# Patient Record
Sex: Male | Born: 1940
Health system: Southern US, Community
[De-identification: ages and names within clinical notes are randomized; demographics above are authoritative.]

## PROBLEM LIST (undated history)

## (undated) DIAGNOSIS — E7800 Pure hypercholesterolemia, unspecified: Secondary | ICD-10-CM

## (undated) DIAGNOSIS — I10 Essential (primary) hypertension: Secondary | ICD-10-CM

## (undated) DIAGNOSIS — E119 Type 2 diabetes mellitus without complications: Secondary | ICD-10-CM

## (undated) HISTORY — PX: BACK SURGERY: SHX140

---

## 2001-04-28 ENCOUNTER — Ambulatory Visit (HOSPITAL_COMMUNITY): Admission: RE | Admit: 2001-04-28 | Discharge: 2001-04-28 | Payer: Self-pay | Admitting: Family Medicine

## 2001-04-28 ENCOUNTER — Ambulatory Visit (HOSPITAL_COMMUNITY): Admission: RE | Admit: 2001-04-28 | Discharge: 2001-04-28 | Payer: Self-pay | Admitting: Pulmonary Disease

## 2001-04-28 ENCOUNTER — Encounter: Payer: Self-pay | Admitting: Family Medicine

## 2001-05-18 ENCOUNTER — Encounter: Payer: Self-pay | Admitting: Family Medicine

## 2001-05-18 ENCOUNTER — Ambulatory Visit (HOSPITAL_COMMUNITY): Admission: RE | Admit: 2001-05-18 | Discharge: 2001-05-18 | Payer: Self-pay | Admitting: Family Medicine

## 2001-06-29 ENCOUNTER — Encounter (INDEPENDENT_AMBULATORY_CARE_PROVIDER_SITE_OTHER): Payer: Self-pay | Admitting: Internal Medicine

## 2001-06-29 ENCOUNTER — Ambulatory Visit (HOSPITAL_COMMUNITY): Admission: RE | Admit: 2001-06-29 | Discharge: 2001-06-29 | Payer: Self-pay | Admitting: Internal Medicine

## 2002-05-08 ENCOUNTER — Ambulatory Visit (HOSPITAL_COMMUNITY): Admission: RE | Admit: 2002-05-08 | Discharge: 2002-05-08 | Payer: Self-pay | Admitting: Family Medicine

## 2002-05-08 ENCOUNTER — Encounter: Payer: Self-pay | Admitting: Family Medicine

## 2003-05-31 ENCOUNTER — Ambulatory Visit (HOSPITAL_COMMUNITY): Admission: RE | Admit: 2003-05-31 | Discharge: 2003-05-31 | Payer: Self-pay | Admitting: Family Medicine

## 2004-01-24 ENCOUNTER — Ambulatory Visit (HOSPITAL_COMMUNITY): Admission: RE | Admit: 2004-01-24 | Discharge: 2004-01-24 | Payer: Self-pay | Admitting: Urology

## 2006-03-08 ENCOUNTER — Ambulatory Visit (HOSPITAL_COMMUNITY): Admission: RE | Admit: 2006-03-08 | Discharge: 2006-03-08 | Payer: Self-pay | Admitting: Family Medicine

## 2010-05-17 ENCOUNTER — Encounter: Payer: Self-pay | Admitting: Family Medicine

## 2011-09-12 ENCOUNTER — Emergency Department (HOSPITAL_COMMUNITY): Payer: Medicare Other

## 2011-09-12 ENCOUNTER — Encounter (HOSPITAL_COMMUNITY): Payer: Self-pay | Admitting: *Deleted

## 2011-09-12 ENCOUNTER — Emergency Department (HOSPITAL_COMMUNITY)
Admission: EM | Admit: 2011-09-12 | Discharge: 2011-09-12 | Disposition: A | Discharge status: Home / Self Care | Payer: Medicare Other | Attending: Emergency Medicine | Admitting: Emergency Medicine

## 2011-09-12 DIAGNOSIS — R110 Nausea: Secondary | ICD-10-CM | POA: Insufficient documentation

## 2011-09-12 DIAGNOSIS — I498 Other specified cardiac arrhythmias: Secondary | ICD-10-CM | POA: Insufficient documentation

## 2011-09-12 DIAGNOSIS — I10 Essential (primary) hypertension: Secondary | ICD-10-CM | POA: Insufficient documentation

## 2011-09-12 DIAGNOSIS — J3489 Other specified disorders of nose and nasal sinuses: Secondary | ICD-10-CM | POA: Insufficient documentation

## 2011-09-12 DIAGNOSIS — I459 Conduction disorder, unspecified: Secondary | ICD-10-CM | POA: Insufficient documentation

## 2011-09-12 DIAGNOSIS — I440 Atrioventricular block, first degree: Secondary | ICD-10-CM | POA: Insufficient documentation

## 2011-09-12 DIAGNOSIS — H81399 Other peripheral vertigo, unspecified ear: Secondary | ICD-10-CM | POA: Insufficient documentation

## 2011-09-12 HISTORY — DX: Pure hypercholesterolemia, unspecified: E78.00

## 2011-09-12 HISTORY — DX: Essential (primary) hypertension: I10

## 2011-09-12 LAB — CBC
MCH: 31.2 pg (ref 26.0–34.0)
Platelets: 208 10*3/uL (ref 150–400)
RBC: 5.04 MIL/uL (ref 4.22–5.81)
RDW: 13 % (ref 11.5–15.5)
WBC: 10.8 10*3/uL — ABNORMAL HIGH (ref 4.0–10.5)

## 2011-09-12 LAB — BASIC METABOLIC PANEL
Calcium: 9.9 mg/dL (ref 8.4–10.5)
GFR calc Af Amer: >90 mL/min (ref >90)
GFR calc non Af Amer: 89 mL/min — ABNORMAL LOW (ref >90)
Glucose, Bld: 104 mg/dL — ABNORMAL HIGH (ref 70–99)
Potassium: 4.1 mEq/L (ref 3.5–5.1)
Sodium: 137 mEq/L (ref 135–145)

## 2011-09-12 LAB — DIFFERENTIAL
Basophils Absolute: 0 10*3/uL (ref 0.0–0.1)
Eosinophils Absolute: 0.2 10*3/uL (ref 0.0–0.7)
Lymphs Abs: 1.6 10*3/uL (ref 0.7–4.0)
Neutrophils Relative %: 78 % — ABNORMAL HIGH (ref 43–77)

## 2011-09-12 LAB — GLUCOSE, CAPILLARY: Glucose-Capillary: 178 mg/dL — ABNORMAL HIGH (ref 70–99)

## 2011-09-12 MED ORDER — MECLIZINE HCL 12.5 MG PO TABS
25.0000 mg | ORAL_TABLET | Freq: Once | ORAL | Status: AC
  Administered 2011-09-12: 25 mg via ORAL
  Filled 2011-09-12: qty 2

## 2011-09-12 MED ORDER — MECLIZINE HCL 25 MG PO TABS: 25.0000 mg | ORAL_TABLET | Freq: Three times a day (TID) | ORAL | Status: AC | PRN

## 2011-09-12 MED ORDER — SODIUM CHLORIDE 0.9 % IV BOLUS (SEPSIS)
500.0000 mL | Freq: Once | INTRAVENOUS | Status: AC
  Administered 2011-09-12: 500 mL via INTRAVENOUS

## 2011-09-12 MED ORDER — SODIUM CHLORIDE 0.9 % IV SOLN
Freq: Once | INTRAVENOUS | Status: AC
  Administered 2011-09-12: 16:00:00 via INTRAVENOUS

## 2011-09-12 MED ORDER — ONDANSETRON HCL 4 MG/2ML IJ SOLN
4.0000 mg | INTRAMUSCULAR | Status: DC | PRN
  Administered 2011-09-12: 4 mg via INTRAVENOUS
  Filled 2011-09-12: qty 2

## 2011-09-12 MED ORDER — SODIUM CHLORIDE 0.9 % IV SOLN
INTRAVENOUS | Status: DC
  Administered 2011-09-12: 17:00:00 via INTRAVENOUS

## 2011-09-12 NOTE — ED Notes (Signed)
Patient returned from xray at this time.

## 2011-09-12 NOTE — Discharge Instructions (Signed)
RESOURCE GUIDE  Dental Problems  Patients with Medicaid: [facility]                     [facility] [address]                                           [address] Phone:  [phone]                                                  Phone:  [phone]  If unable to pay or uninsured, contact:  [facility] or [facility] to become qualified for the adult dental clinic.  Chronic Pain Problems Contact [facility]  [phone] Patients need to be referred by their primary care doctor.  Insufficient Money for Medicine Contact [employer]:  call "211" or [facility] 271-5999.  No Primary Care Doctor Call [facility]  832-8000 Other agencies that provide inexpensive medical care    [facility]  [phone]    [facility]  [phone]    [facility]  [phone]    [facility] Clinic  [phone]    [facility]  [phone]    [facility]  [phone]  Psychological Services [facility]  [phone] [facility]  [phone] [facility]   800 853-5163 (emergency services 641-4993)  Substance Abuse Resources [facility]  336-882-2125 [facility] 336-784-9470 [facility] 336-285-9073 [facility] 336-845-3988 Residential & Outpatient Substance Abuse Program  800-659-3381  Abuse/Neglect [place] Child Abuse Hotline (336) 641-3795 [place] Child Abuse Hotline 800-378-5315 (After Hours)  Emergency Shelter [facility] (336) 271-5985  Maternity Homes [facility] (336) 275-9566 [facility] (704) 372-4663  MRSA Hotline #:   832-7006    [place] [facility]  [facility] [place]     [employer]                          [facility] [address] [place]                       [address]      [address]  [place]                                                [place]                            [place] Phone:  [phone]                                   Phone:  [phone]                 Phone:  [phone]  [facility] Phone:  [phone]    [facility] Child Abuse Hotline 8564561946 (276)111-6985 (After Hours)   Take the prescription as directed.  Call your regular medical doctor tomorrow morning to schedule a follow up appointment within the next 3 to 4 days.  Return to the Emergency Department immediately sooner if worsening.

## 2011-09-12 NOTE — ED Notes (Signed)
Patient with no complaints at this time. Respirations even and unlabored. Skin warm/dry. Discharge instructions reviewed with patient at this time. Patient given opportunity to voice concerns/ask questions. IV removed per policy and band-aid applied to site. Patient discharged at this time and left Emergency Department with steady gait.  

## 2011-09-12 NOTE — ED Notes (Signed)
Patient ambulated without difficulty and without complaints.

## 2011-09-12 NOTE — ED Notes (Signed)
Dr. McManus at bedside. 

## 2011-09-12 NOTE — ED Provider Notes (Signed)
History     CSN: [id number]  Arrival date & time 09/12/11  1331   First MD Initiated Contact with Patient 09/12/11 1458      Chief Complaint  Patient presents with  . Dizziness  . Hyperglycemia     HPI Pt was seen at 1510.   Per pt, c/o gradual onset and persistence of constantly feeling "dizzy" for the past 2 days.  Dizziness is described as a sense of movement and feeling "off balance" while walking.  Symptoms worsen with head turning side-to-side and changing positions; improve when sitting or laying still.  Has been associated with nausea, sinus and nasal congestion.  Denies abd pain, no vomiting/diarrhea, no CP/palpitations, no SOB/cough, no neck or back pain, no headache, no visual changes, no focal motor weakness, no tingling/numbness in extremities, no fevers, no rash, no head or neck injuries.     Past Medical History  Diagnosis Date  . Hypertension   . Hypercholesterolemia     Past Surgical History  Procedure Date  . Back surgery     History  Substance Use Topics  . Smoking status: Never Smoker   . Smokeless tobacco: Not on file  . Alcohol Use: No    Review of Systems ROS: Statement: All systems negative except as marked or noted in the HPI; Constitutional: Negative for fever and chills. ; ; Eyes: Negative for eye pain, redness and discharge. ; ; ENMT: Negative for ear pain, hoarseness, sore throat. +nasal congestion, sinus pressure. ; ; Cardiovascular: Negative for chest pain, palpitations, diaphoresis, dyspnea and peripheral edema. ; ; Respiratory: Negative for cough, wheezing and stridor. ; ; Gastrointestinal: +nausea. Negative for vomiting, diarrhea, abdominal pain, blood in stool, hematemesis, jaundice and rectal bleeding. . ; ; Genitourinary: Negative for dysuria, flank pain and hematuria. ; ; Musculoskeletal: Negative for back pain and neck pain. Negative for swelling and trauma.; ; Skin: Negative for pruritus, rash, abrasions, blisters, bruising and skin  lesion.; ; Neuro: +vertigo.  Negative for headache, lightheadedness and neck stiffness. Negative for weakness, altered level of consciousness , altered mental status, extremity weakness, paresthesias, involuntary movement, seizure and syncope.     Allergies  Other  Home Medications  No current outpatient prescriptions on file.  BP 118/68  Pulse 57  Temp 98 F (36.7 C)  Resp 20  Ht 6' (1.829 m)  Wt 197 lb (89.359 kg)  BMI 26.72 kg/m2  SpO2 98%  Physical Exam 1515: Physical examination:  Nursing notes reviewed; Vital signs and O2 SAT reviewed;  Constitutional: Well developed, Well nourished, Well hydrated, In no acute distress; Head:  Normocephalic, atraumatic; Eyes: EOMI, PERRL, No scleral icterus; ENMT: TM's clear bilat. +edemetous nasal turbinates bilat with clear rhinorrhea. Mouth and pharynx normal, Mucous membranes moist; Neck: Supple, Full range of motion, No lymphadenopathy; Cardiovascular: Regular rate and rhythm, No murmur or gallop; Respiratory: Breath sounds clear & equal bilaterally, No rales, rhonchi, wheezes, speaking full sentences with ease, Normal respiratory effort/excursion; Chest: Nontender, Movement normal; Abdomen: Soft, Nontender, Nondistended, Normal bowel sounds; Genitourinary: No CVA tenderness; Extremities: Pulses normal, No tenderness, No edema, No calf edema or asymmetry.; Neuro: AA&Ox3, Major CN grossly intact.  Strength 5/5 equal bilat UE's and LE's.  DTR 2/4 equal bilat UE's and LE's.  No gross sensory deficits.  Normal cerebellar testing bilat UE's and LE's. Speech clear.  No facial droop.  +right gaze horizontal fatigable nystagmus which reproduces pt's symptoms.;; Skin: Color normal, Warm, Dry, no rash.    ED Course  Procedures  MDM  MDM Reviewed: nursing note and vitals Interpretation: ECG, labs, x-ray and CT scan    Date: 09/12/2011  Rate: 56  Rhythm: sinus bradycardia  QRS Axis: normal  Intervals: PR prolonged  ST/T Wave abnormalities: early  repolarization  Conduction Disutrbances:first-degree A-V block  and nonspecific intraventricular conduction delay  Narrative Interpretation:   Old EKG Reviewed: none available.     Results for orders placed during the hospital encounter of 09/12/11  GLUCOSE, CAPILLARY      Component Value Range   Glucose-Capillary 178 (*) 70 - 99 (mg/dL)  TROPONIN I      Component Value Range   Troponin I <0.30  <0.30 (ng/mL)  CBC      Component Value Range   WBC 10.8 (*) 4.0 - 10.5 (K/uL)   RBC 5.04  4.22 - 5.81 (MIL/uL)   Hemoglobin 15.7  13.0 - 17.0 (g/dL)   HCT 16.1  09.6 - 04.5 (%)   MCV 90.5  78.0 - 100.0 (fL)   MCH 31.2  26.0 - 34.0 (pg)   MCHC 34.4  30.0 - 36.0 (g/dL)   RDW 40.9  81.1 - 91.4 (%)   Platelets 208  150 - 400 (K/uL)  DIFFERENTIAL      Component Value Range   Neutrophils Relative 78 (*) 43 - 77 (%)   Neutro Abs 8.4 (*) 1.7 - 7.7 (K/uL)   Lymphocytes Relative 15  12 - 46 (%)   Lymphs Abs 1.6  0.7 - 4.0 (K/uL)   Monocytes Relative 5  3 - 12 (%)   Monocytes Absolute 0.5  0.1 - 1.0 (K/uL)   Eosinophils Relative 2  0 - 5 (%)   Eosinophils Absolute 0.2  0.0 - 0.7 (K/uL)   Basophils Relative 0  0 - 1 (%)   Basophils Absolute 0.0  0.0 - 0.1 (K/uL)  BASIC METABOLIC PANEL      Component Value Range   Sodium 137  135 - 145 (mEq/L)   Potassium 4.1  3.5 - 5.1 (mEq/L)   Chloride 102  96 - 112 (mEq/L)   CO2 21  19 - 32 (mEq/L)   Glucose, Bld 104 (*) 70 - 99 (mg/dL)   BUN 11  6 - 23 (mg/dL)   Creatinine, Ser 7.82  0.50 - 1.35 (mg/dL)   Calcium 9.9  8.4 - 95.6 (mg/dL)   GFR calc non Af Amer 89 (*) >90 (mL/min)   GFR calc Af Amer >90  >90 (mL/min)   Dg Chest 2 View 09/12/2011  *RADIOLOGY REPORT*  Clinical Data: Dizziness.  Nausea and weakness.  CHEST - 2 VIEW  Comparison: None  Findings: Heart size and vascularity are normal and the lungs are clear except for a tiny area of linear atelectasis or scarring in the left lung base laterally.  No effusions.  No acute osseous abnormality.   IMPRESSION: No significant abnormality.  Original Report Authenticated By: Gwynn Burly, M.D.   Ct Head Wo Contrast 09/12/2011  *RADIOLOGY REPORT*  Clinical Data: Dizziness.  Nausea  CT HEAD WITHOUT CONTRAST  Technique:  Contiguous axial images were obtained from the base of the skull through the vertex without contrast.  Comparison: None.  Findings: Age appropriate atrophy.  Negative for intracranial hemorrhage, mass, or acute infarct.  Calvarium is intact. Paranasal sinuses and mastoid sinuses are clear.  IMPRESSION: Within normal limits for age.  Original Report Authenticated By: Camelia Phenes, M.D.     1745:  Pt states he feels "better" and wants to  go home now.  Pt has tol PO well while in the ED without N/V.  Has ambulated with steady gait, easy resps; denies "dizziness," CP, SOB or any other complaints.   Not symptomatic with orthostatic VS, though IVF given.  Wants to go home now.  Dx testing d/w pt.  Questions answered.  Verb understanding, agreeable to d/c home with outpt f/u.               Laray Anger, DO 09/15/11 1208

## 2011-09-12 NOTE — ED Notes (Addendum)
Pt presents to er with c/o "my blood sugar is up", pt c/o being dizzy, off balance, and elevated blood sugar, pt states that he is not diabetic and does not check his blood sugar but knows that it what is wrong with him, pt denies any weakness to extremities, speech clear, no facial drooping noted, admits to nausea

## 2015-07-30 ENCOUNTER — Telehealth: Payer: Self-pay

## 2015-07-30 NOTE — Telephone Encounter (Signed)
Pt was referred by Dr. Eden EmmsAriza for a screening colonoscopy and I sent him the letter. He called and said he has a lot going on at this time and he will call when he is ready to schedule. I am sending a note to his PCP.

## 2016-05-20 DIAGNOSIS — R69 Illness, unspecified: Secondary | ICD-10-CM | POA: Diagnosis not present

## 2016-06-22 DIAGNOSIS — E7800 Pure hypercholesterolemia, unspecified: Secondary | ICD-10-CM | POA: Diagnosis not present

## 2016-06-22 DIAGNOSIS — H6122 Impacted cerumen, left ear: Secondary | ICD-10-CM | POA: Diagnosis not present

## 2016-06-22 DIAGNOSIS — E118 Type 2 diabetes mellitus with unspecified complications: Secondary | ICD-10-CM | POA: Diagnosis not present

## 2016-06-22 DIAGNOSIS — I10 Essential (primary) hypertension: Secondary | ICD-10-CM | POA: Diagnosis not present

## 2016-07-27 DIAGNOSIS — I10 Essential (primary) hypertension: Secondary | ICD-10-CM | POA: Diagnosis not present

## 2016-07-27 DIAGNOSIS — E118 Type 2 diabetes mellitus with unspecified complications: Secondary | ICD-10-CM | POA: Diagnosis not present

## 2016-07-27 DIAGNOSIS — E785 Hyperlipidemia, unspecified: Secondary | ICD-10-CM | POA: Diagnosis not present

## 2016-08-28 DIAGNOSIS — E782 Mixed hyperlipidemia: Secondary | ICD-10-CM | POA: Diagnosis not present

## 2016-08-28 DIAGNOSIS — E1122 Type 2 diabetes mellitus with diabetic chronic kidney disease: Secondary | ICD-10-CM | POA: Diagnosis not present

## 2016-08-28 DIAGNOSIS — E1165 Type 2 diabetes mellitus with hyperglycemia: Secondary | ICD-10-CM | POA: Diagnosis not present

## 2016-08-28 DIAGNOSIS — N401 Enlarged prostate with lower urinary tract symptoms: Secondary | ICD-10-CM | POA: Diagnosis not present

## 2016-08-28 DIAGNOSIS — Z6824 Body mass index (BMI) 24.0-24.9, adult: Secondary | ICD-10-CM | POA: Diagnosis not present

## 2016-08-28 DIAGNOSIS — I10 Essential (primary) hypertension: Secondary | ICD-10-CM | POA: Diagnosis not present

## 2016-08-31 DIAGNOSIS — Z0001 Encounter for general adult medical examination with abnormal findings: Secondary | ICD-10-CM | POA: Diagnosis not present

## 2016-08-31 DIAGNOSIS — E1165 Type 2 diabetes mellitus with hyperglycemia: Secondary | ICD-10-CM | POA: Diagnosis not present

## 2016-08-31 DIAGNOSIS — E782 Mixed hyperlipidemia: Secondary | ICD-10-CM | POA: Diagnosis not present

## 2016-08-31 DIAGNOSIS — I10 Essential (primary) hypertension: Secondary | ICD-10-CM | POA: Diagnosis not present

## 2016-08-31 DIAGNOSIS — Z6824 Body mass index (BMI) 24.0-24.9, adult: Secondary | ICD-10-CM | POA: Diagnosis not present

## 2016-08-31 DIAGNOSIS — M25562 Pain in left knee: Secondary | ICD-10-CM | POA: Diagnosis not present

## 2016-08-31 DIAGNOSIS — N401 Enlarged prostate with lower urinary tract symptoms: Secondary | ICD-10-CM | POA: Diagnosis not present

## 2016-10-30 DIAGNOSIS — Z0000 Encounter for general adult medical examination without abnormal findings: Secondary | ICD-10-CM | POA: Diagnosis not present

## 2016-10-30 DIAGNOSIS — R42 Dizziness and giddiness: Secondary | ICD-10-CM | POA: Diagnosis not present

## 2016-10-30 DIAGNOSIS — Z7984 Long term (current) use of oral hypoglycemic drugs: Secondary | ICD-10-CM | POA: Diagnosis not present

## 2016-10-30 DIAGNOSIS — E785 Hyperlipidemia, unspecified: Secondary | ICD-10-CM | POA: Diagnosis not present

## 2016-10-30 DIAGNOSIS — M2550 Pain in unspecified joint: Secondary | ICD-10-CM | POA: Diagnosis not present

## 2016-10-30 DIAGNOSIS — I10 Essential (primary) hypertension: Secondary | ICD-10-CM | POA: Diagnosis not present

## 2016-10-30 DIAGNOSIS — G3184 Mild cognitive impairment, so stated: Secondary | ICD-10-CM | POA: Diagnosis not present

## 2016-10-30 DIAGNOSIS — N401 Enlarged prostate with lower urinary tract symptoms: Secondary | ICD-10-CM | POA: Diagnosis not present

## 2016-10-30 DIAGNOSIS — E119 Type 2 diabetes mellitus without complications: Secondary | ICD-10-CM | POA: Diagnosis not present

## 2016-10-30 DIAGNOSIS — R2243 Localized swelling, mass and lump, lower limb, bilateral: Secondary | ICD-10-CM | POA: Diagnosis not present

## 2016-12-30 DIAGNOSIS — I10 Essential (primary) hypertension: Secondary | ICD-10-CM | POA: Diagnosis not present

## 2016-12-30 DIAGNOSIS — E1165 Type 2 diabetes mellitus with hyperglycemia: Secondary | ICD-10-CM | POA: Diagnosis not present

## 2017-01-03 DIAGNOSIS — Z6823 Body mass index (BMI) 23.0-23.9, adult: Secondary | ICD-10-CM | POA: Diagnosis not present

## 2017-01-03 DIAGNOSIS — E1165 Type 2 diabetes mellitus with hyperglycemia: Secondary | ICD-10-CM | POA: Diagnosis not present

## 2017-01-03 DIAGNOSIS — Z0001 Encounter for general adult medical examination with abnormal findings: Secondary | ICD-10-CM | POA: Diagnosis not present

## 2017-01-03 DIAGNOSIS — Z23 Encounter for immunization: Secondary | ICD-10-CM | POA: Diagnosis not present

## 2017-01-03 DIAGNOSIS — M25562 Pain in left knee: Secondary | ICD-10-CM | POA: Diagnosis not present

## 2017-01-03 DIAGNOSIS — I499 Cardiac arrhythmia, unspecified: Secondary | ICD-10-CM | POA: Diagnosis not present

## 2017-01-03 DIAGNOSIS — E782 Mixed hyperlipidemia: Secondary | ICD-10-CM | POA: Diagnosis not present

## 2017-01-03 DIAGNOSIS — N401 Enlarged prostate with lower urinary tract symptoms: Secondary | ICD-10-CM | POA: Diagnosis not present

## 2017-01-03 DIAGNOSIS — I10 Essential (primary) hypertension: Secondary | ICD-10-CM | POA: Diagnosis not present

## 2017-01-19 DIAGNOSIS — N401 Enlarged prostate with lower urinary tract symptoms: Secondary | ICD-10-CM | POA: Diagnosis not present

## 2017-01-19 DIAGNOSIS — I10 Essential (primary) hypertension: Secondary | ICD-10-CM | POA: Diagnosis not present

## 2017-01-19 DIAGNOSIS — Z6823 Body mass index (BMI) 23.0-23.9, adult: Secondary | ICD-10-CM | POA: Diagnosis not present

## 2017-01-19 DIAGNOSIS — Z5181 Encounter for therapeutic drug level monitoring: Secondary | ICD-10-CM | POA: Diagnosis not present

## 2017-02-22 DIAGNOSIS — E119 Type 2 diabetes mellitus without complications: Secondary | ICD-10-CM | POA: Diagnosis not present

## 2017-03-31 DIAGNOSIS — N401 Enlarged prostate with lower urinary tract symptoms: Secondary | ICD-10-CM | POA: Diagnosis not present

## 2017-03-31 DIAGNOSIS — E782 Mixed hyperlipidemia: Secondary | ICD-10-CM | POA: Diagnosis not present

## 2017-03-31 DIAGNOSIS — I10 Essential (primary) hypertension: Secondary | ICD-10-CM | POA: Diagnosis not present

## 2017-03-31 DIAGNOSIS — E1165 Type 2 diabetes mellitus with hyperglycemia: Secondary | ICD-10-CM | POA: Diagnosis not present

## 2017-04-14 DIAGNOSIS — E782 Mixed hyperlipidemia: Secondary | ICD-10-CM | POA: Diagnosis not present

## 2017-04-14 DIAGNOSIS — Z0001 Encounter for general adult medical examination with abnormal findings: Secondary | ICD-10-CM | POA: Diagnosis not present

## 2017-04-14 DIAGNOSIS — M25562 Pain in left knee: Secondary | ICD-10-CM | POA: Diagnosis not present

## 2017-04-14 DIAGNOSIS — I10 Essential (primary) hypertension: Secondary | ICD-10-CM | POA: Diagnosis not present

## 2017-04-14 DIAGNOSIS — N401 Enlarged prostate with lower urinary tract symptoms: Secondary | ICD-10-CM | POA: Diagnosis not present

## 2017-04-14 DIAGNOSIS — E1165 Type 2 diabetes mellitus with hyperglycemia: Secondary | ICD-10-CM | POA: Diagnosis not present

## 2017-04-14 DIAGNOSIS — Z6823 Body mass index (BMI) 23.0-23.9, adult: Secondary | ICD-10-CM | POA: Diagnosis not present

## 2017-05-11 ENCOUNTER — Emergency Department (HOSPITAL_COMMUNITY)
Admission: EM | Admit: 2017-05-11 | Discharge: 2017-05-11 | Disposition: A | Discharge status: Home / Self Care | Payer: Medicare HMO | Attending: Emergency Medicine | Admitting: Emergency Medicine

## 2017-05-11 ENCOUNTER — Encounter (HOSPITAL_COMMUNITY): Payer: Self-pay | Admitting: Emergency Medicine

## 2017-05-11 ENCOUNTER — Emergency Department (HOSPITAL_COMMUNITY): Payer: Medicare HMO

## 2017-05-11 ENCOUNTER — Other Ambulatory Visit: Payer: Self-pay

## 2017-05-11 DIAGNOSIS — I10 Essential (primary) hypertension: Secondary | ICD-10-CM | POA: Insufficient documentation

## 2017-05-11 DIAGNOSIS — E119 Type 2 diabetes mellitus without complications: Secondary | ICD-10-CM | POA: Diagnosis not present

## 2017-05-11 DIAGNOSIS — R4020 Unspecified coma: Secondary | ICD-10-CM | POA: Diagnosis not present

## 2017-05-11 DIAGNOSIS — Z7984 Long term (current) use of oral hypoglycemic drugs: Secondary | ICD-10-CM | POA: Insufficient documentation

## 2017-05-11 DIAGNOSIS — R42 Dizziness and giddiness: Secondary | ICD-10-CM | POA: Insufficient documentation

## 2017-05-11 DIAGNOSIS — Z79899 Other long term (current) drug therapy: Secondary | ICD-10-CM | POA: Diagnosis not present

## 2017-05-11 HISTORY — DX: Type 2 diabetes mellitus without complications: E11.9

## 2017-05-11 LAB — CBC WITH DIFFERENTIAL/PLATELET
BASOS ABS: 0 10*3/uL (ref 0.0–0.1)
BASOS PCT: 1 %
EOS ABS: 0.2 10*3/uL (ref 0.0–0.7)
Eosinophils Relative: 2 %
HEMATOCRIT: 42.5 % (ref 39.0–52.0)
Hemoglobin: 14.1 g/dL (ref 13.0–17.0)
Lymphocytes Relative: 15 %
Lymphs Abs: 1.2 10*3/uL (ref 0.7–4.0)
MCH: 29.7 pg (ref 26.0–34.0)
MCHC: 33.2 g/dL (ref 30.0–36.0)
MCV: 89.7 fL (ref 78.0–100.0)
Monocytes Absolute: 0.3 10*3/uL (ref 0.1–1.0)
Monocytes Relative: 4 %
NEUTROS ABS: 6.6 10*3/uL (ref 1.7–7.7)
NEUTROS PCT: 78 %
Platelets: 172 10*3/uL (ref 150–400)
RBC: 4.74 MIL/uL (ref 4.22–5.81)
RDW: 13.5 % (ref 11.5–15.5)
WBC: 8.3 10*3/uL (ref 4.0–10.5)

## 2017-05-11 LAB — COMPREHENSIVE METABOLIC PANEL
ALBUMIN: 4.2 g/dL (ref 3.5–5.0)
ALK PHOS: 56 U/L (ref 38–126)
ALT: 13 U/L — ABNORMAL LOW (ref 17–63)
ANION GAP: 9 (ref 5–15)
AST: 15 U/L (ref 15–41)
BILIRUBIN TOTAL: 0.7 mg/dL (ref 0.3–1.2)
BUN: 19 mg/dL (ref 6–20)
CALCIUM: 9.8 mg/dL (ref 8.9–10.3)
CO2: 27 mmol/L (ref 22–32)
Chloride: 103 mmol/L (ref 101–111)
Creatinine, Ser: 0.9 mg/dL (ref 0.61–1.24)
GFR calc Af Amer: >60 mL/min (ref >60)
GFR calc non Af Amer: >60 mL/min (ref >60)
GLUCOSE: 155 mg/dL — AB (ref 65–99)
Potassium: 4.5 mmol/L (ref 3.5–5.1)
SODIUM: 139 mmol/L (ref 135–145)
TOTAL PROTEIN: 7.3 g/dL (ref 6.5–8.1)

## 2017-05-11 LAB — CBG MONITORING, ED
GLUCOSE-CAPILLARY: 153 mg/dL — AB (ref 65–99)
GLUCOSE-CAPILLARY: 142 mg/dL — AB (ref 65–99)

## 2017-05-11 LAB — TROPONIN I: Troponin I: <0.03 ng/mL (ref <0.03)

## 2017-05-11 MED ORDER — ONDANSETRON 4 MG PO TBDP: ORAL_TABLET | ORAL | 0 refills | Status: DC

## 2017-05-11 MED ORDER — MECLIZINE HCL 12.5 MG PO TABS
25.0000 mg | ORAL_TABLET | Freq: Once | ORAL | Status: AC
  Administered 2017-05-11: 25 mg via ORAL
  Filled 2017-05-11: qty 2

## 2017-05-11 MED ORDER — MECLIZINE HCL 25 MG PO TABS: 25.0000 mg | ORAL_TABLET | Freq: Three times a day (TID) | ORAL | 0 refills | Status: DC | PRN

## 2017-05-11 MED ORDER — SODIUM CHLORIDE 0.9 % IV BOLUS (SEPSIS)
500.0000 mL | Freq: Once | INTRAVENOUS | Status: AC
  Administered 2017-05-11: 500 mL via INTRAVENOUS

## 2017-05-11 MED ORDER — ONDANSETRON HCL 4 MG/2ML IJ SOLN
4.0000 mg | Freq: Once | INTRAMUSCULAR | Status: AC
  Administered 2017-05-11: 4 mg via INTRAVENOUS
  Filled 2017-05-11: qty 2

## 2017-05-11 NOTE — ED Triage Notes (Signed)
Pt reports his BP and glucose have been elevated. Woke this am with dizziness and vomiting. Pt was checked by EMS this am.

## 2017-05-11 NOTE — ED Notes (Signed)
Patient transported to CT 

## 2017-05-11 NOTE — ED Provider Notes (Signed)
[facility]ENCY DEPARTMENT Provider Note   CSN: [id number] Arrival date & time: 05/11/17  1117     History   Chief Complaint Chief Complaint  Patient presents with  . Dizziness    HPI Colton Johnson is a 77 y.o. male.  Patient complains of dizziness.  Patient states that today he started having the feeling that his head was spinning.  It is improving now    The history is provided by the patient.  Dizziness  Quality:  Head spinning Severity:  Moderate Onset quality:  Sudden Timing:  Constant Progression:  Waxing and waning Chronicity:  New Context: not when bending over   Relieved by:  Nothing Worsened by:  Nothing Ineffective treatments:  None tried Associated symptoms: no chest pain, no diarrhea and no headaches     Past Medical History:  Diagnosis Date  . Diabetes mellitus without complication (HCC)   . Hypercholesterolemia   . Hypertension     There are no active problems to display for this patient.   Past Surgical History:  Procedure Laterality Date  . BACK SURGERY         Home Medications    Prior to Admission medications   Medication Sig Start Date End Date Taking? Authorizing Provider  atenolol (TENORMIN) 25 MG tablet Take 25 mg by mouth daily.   Yes [provider]  metFORMIN (GLUCOPHAGE) 500 MG tablet Take 500 mg by mouth 3 (three) times daily.   Yes [provider]  simvastatin (ZOCOR) 40 MG tablet Take 40 mg by mouth daily.   Yes [provider]  tamsulosin (FLOMAX) 0.4 MG CAPS capsule Take 0.4 mg by mouth daily.   Yes [provider]  triamterene-hydrochlorothiazide (DYAZIDE) 37.5-25 MG capsule Take 1 capsule by mouth daily.   Yes [provider]  meclizine (ANTIVERT) 25 MG tablet Take 1 tablet (25 mg total) by mouth 3 (three) times daily as needed for dizziness. 05/11/17   Bethann BerkshireZammit, Shonte Beutler, MD  ondansetron (ZOFRAN ODT) 4 MG disintegrating tablet 4mg  ODT q4 hours prn nausea/vomit 05/11/17    Bethann BerkshireZammit, Jeannett Dekoning, MD    Family History Family History  Problem Relation Age of Onset  . Diabetes Mother   . Heart disease Father   . Cancer Brother     Social History Social History   Tobacco Use  . Smoking status: Never Smoker  . Smokeless tobacco: Current User    Types: Chew  Substance Use Topics  . Alcohol use: No  . Drug use: No     Allergies   Aleve [naproxen sodium] and Other   Review of Systems Review of Systems  Constitutional: Negative for appetite change and fatigue.  HENT: Negative for congestion, ear discharge and sinus pressure.   Eyes: Negative for discharge.  Respiratory: Negative for cough.   Cardiovascular: Negative for chest pain.  Gastrointestinal: Negative for abdominal pain and diarrhea.  Genitourinary: Negative for frequency and hematuria.  Musculoskeletal: Negative for back pain.  Skin: Negative for rash.  Neurological: Positive for dizziness. Negative for seizures and headaches.  Psychiatric/Behavioral: Negative for hallucinations.     Physical Exam Updated Vital Signs BP 126/66   Pulse (!) 53   Temp 97.6 F (36.4 C) (Oral)   Resp 12   Ht 6' (1.829 m)   Wt 80.7 kg (178 lb)   SpO2 100%   BMI 24.14 kg/m   Physical Exam  Constitutional: He is oriented to person, place, and time. He appears well-developed.  HENT:  Head: Normocephalic.  Eyes: Conjunctivae and EOM are normal. No scleral icterus.  Neck: Neck supple. No thyromegaly present.  Cardiovascular: Normal rate and regular rhythm. Exam reveals no gallop and no friction rub.  No murmur heard. Pulmonary/Chest: No stridor. He has no wheezes. He has no rales. He exhibits no tenderness.  Abdominal: He exhibits no distension. There is no tenderness. There is no rebound.  Musculoskeletal: Normal range of motion. He exhibits no edema.  Lymphadenopathy:    He has no cervical adenopathy.  Neurological: He is oriented to person, place, and time. He exhibits normal muscle tone.  Coordination normal.  Skin: No rash noted. No erythema.  Psychiatric: He has a normal mood and affect. His behavior is normal.     ED Treatments / Results  Labs (all labs ordered are listed, but only abnormal results are displayed) Labs Reviewed  COMPREHENSIVE METABOLIC PANEL - Abnormal; Notable for the following components:      Result Value   Glucose, Bld 155 (*)    ALT 13 (*)    All other components within normal limits  CBG MONITORING, ED - Abnormal; Notable for the following components:   Glucose-Capillary 153 (*)    All other components within normal limits  CBG MONITORING, ED - Abnormal; Notable for the following components:   Glucose-Capillary 142 (*)    All other components within normal limits  CBC WITH DIFFERENTIAL/PLATELET  TROPONIN I    EKG  EKG Interpretation  Date/Time:  Wednesday May 11 2017 11:47:55 EST Ventricular Rate:  54 PR Interval:  208 QRS Duration: 96 QT Interval:  378 QTC Calculation: 358 R Axis:   55 Text Interpretation:  Sinus bradycardia with Premature supraventricular complexes Otherwise normal ECG since last tracing no significant change Confirmed by Mancel Bale [phone]) on 05/11/2017 2:00:37 PM       Radiology Dg Chest 2 View  Result Date: 05/11/2017 CLINICAL DATA:  Awakened this morning with dizziness and vomiting. EXAM: CHEST  2 VIEW COMPARISON:  Chest x-ray of Sep 12, 2011 FINDINGS: The lungs are well-expanded. The interstitial markings are coarse. There is no alveolar infiltrate or pleural effusion. The heart and pulmonary vascularity are normal. There is calcification in the wall of the aortic arch. The observed bony thorax is unremarkable. The gas pattern in the upper abdomen is normal. IMPRESSION: No focal pneumonia nor CHF. Slight overall increase in the prominence of the pulmonary interstitial markings may reflect bronchitic changes either acute or chronic. Thoracic aortic atherosclerosis. Electronically Signed   By: David   [place] M.D.   On: 05/11/2017 16:12   Ct Head Wo Contrast  Result Date: 05/11/2017 CLINICAL DATA:  Altered level of consciousness. EXAM: CT HEAD WITHOUT CONTRAST TECHNIQUE: Contiguous axial images were obtained from the base of the skull through the vertex without intravenous contrast. COMPARISON:  CT head 09/12/2011 FINDINGS: Brain: No evidence of acute infarction, hemorrhage, hydrocephalus, extra-axial collection or mass lesion/mass effect. Vascular: Negative for hyperdense vessel Skull: Negative Sinuses/Orbits: Negative Other: None IMPRESSION: Negative CT head Electronically Signed   By: Marlan Palau M.D.   On: 05/11/2017 16:02    Procedures Procedures (including critical care time)  Medications Ordered in ED Medications  sodium chloride 0.9 % bolus 500 mL (not administered)  ondansetron (ZOFRAN) injection 4 mg (not administered)  meclizine (ANTIVERT) tablet 25 mg (not administered)     Initial Impression / Assessment and Plan / ED Course  I have reviewed the triage vital signs and the nursing notes.  Pertinent labs & imaging results that  were available during my care of the patient were reviewed by me and considered in my medical decision making (see chart for details).     Patient's labs and CT scan unremarkable.  Patient improved with Zofran and Antivert.  He will be sent home with his medicines and follow-up with PCP  Final Clinical Impressions(s) / ED Diagnoses   Final diagnoses:  Vertigo    ED Discharge Orders        Ordered    ondansetron (ZOFRAN ODT) 4 MG disintegrating tablet     05/11/17 1642    meclizine (ANTIVERT) 25 MG tablet  3 times daily PRN     05/11/17 1642       Bethann Berkshire, MD 05/11/17 1646

## 2017-05-11 NOTE — ED Notes (Signed)
Placed meal tray at the bedside for pt as he states that he is very hungry, EDP states that he may eat

## 2017-05-11 NOTE — ED Notes (Signed)
Pt ambulated to Bathroom with steady gait and minimal assistance

## 2017-05-11 NOTE — ED Notes (Signed)
Pt reports that he made a black walnut cake which he ate and this am when he woke up he could not walk.  He states that he also felt dizzy.  Pt feels like it was due to his blood sugar being up.  He reports that it was 170 this am.  Pt reports that he is feeling fine now.  No focal weakness, no dizziness, no changes in speech, no facial droop.  Pt is alert and oriented.  EDP is in to see pt at this time.  Pt is agitates about the wait and about not having eaten.  CBG at this time 142.

## 2017-05-11 NOTE — ED Notes (Signed)
Patient reports he has eaten a lot of sweets lately which he does not normally do. Pt states he took extra blood pressure medication this am.

## 2017-05-11 NOTE — ED Notes (Signed)
Patient transported to X-ray 

## 2017-05-11 NOTE — Discharge Instructions (Signed)
Drink plenty of fluids and follow-up with your doctor next week for recheck return if any problems

## 2017-06-15 DIAGNOSIS — E782 Mixed hyperlipidemia: Secondary | ICD-10-CM | POA: Diagnosis not present

## 2017-06-15 DIAGNOSIS — E1165 Type 2 diabetes mellitus with hyperglycemia: Secondary | ICD-10-CM | POA: Diagnosis not present

## 2017-06-15 DIAGNOSIS — I10 Essential (primary) hypertension: Secondary | ICD-10-CM | POA: Diagnosis not present

## 2017-07-15 DIAGNOSIS — H8149 Vertigo of central origin, unspecified ear: Secondary | ICD-10-CM | POA: Diagnosis not present

## 2017-07-15 DIAGNOSIS — M25562 Pain in left knee: Secondary | ICD-10-CM | POA: Diagnosis not present

## 2017-07-15 DIAGNOSIS — E1165 Type 2 diabetes mellitus with hyperglycemia: Secondary | ICD-10-CM | POA: Diagnosis not present

## 2017-07-15 DIAGNOSIS — I10 Essential (primary) hypertension: Secondary | ICD-10-CM | POA: Diagnosis not present

## 2017-07-15 DIAGNOSIS — Z6823 Body mass index (BMI) 23.0-23.9, adult: Secondary | ICD-10-CM | POA: Diagnosis not present

## 2017-07-15 DIAGNOSIS — Z0001 Encounter for general adult medical examination with abnormal findings: Secondary | ICD-10-CM | POA: Diagnosis not present

## 2017-07-15 DIAGNOSIS — E782 Mixed hyperlipidemia: Secondary | ICD-10-CM | POA: Diagnosis not present

## 2017-07-15 DIAGNOSIS — N401 Enlarged prostate with lower urinary tract symptoms: Secondary | ICD-10-CM | POA: Diagnosis not present

## 2017-07-18 DIAGNOSIS — I10 Essential (primary) hypertension: Secondary | ICD-10-CM | POA: Diagnosis not present

## 2017-07-18 DIAGNOSIS — H8149 Vertigo of central origin, unspecified ear: Secondary | ICD-10-CM | POA: Diagnosis not present

## 2017-07-18 DIAGNOSIS — Z6823 Body mass index (BMI) 23.0-23.9, adult: Secondary | ICD-10-CM | POA: Diagnosis not present

## 2017-07-18 DIAGNOSIS — E1165 Type 2 diabetes mellitus with hyperglycemia: Secondary | ICD-10-CM | POA: Diagnosis not present

## 2017-07-18 DIAGNOSIS — M25562 Pain in left knee: Secondary | ICD-10-CM | POA: Diagnosis not present

## 2017-07-18 DIAGNOSIS — N401 Enlarged prostate with lower urinary tract symptoms: Secondary | ICD-10-CM | POA: Diagnosis not present

## 2017-07-18 DIAGNOSIS — E782 Mixed hyperlipidemia: Secondary | ICD-10-CM | POA: Diagnosis not present

## 2017-10-15 DIAGNOSIS — Z6823 Body mass index (BMI) 23.0-23.9, adult: Secondary | ICD-10-CM | POA: Diagnosis not present

## 2017-10-15 DIAGNOSIS — Z0001 Encounter for general adult medical examination with abnormal findings: Secondary | ICD-10-CM | POA: Diagnosis not present

## 2017-10-15 DIAGNOSIS — H8149 Vertigo of central origin, unspecified ear: Secondary | ICD-10-CM | POA: Diagnosis not present

## 2017-10-15 DIAGNOSIS — E1165 Type 2 diabetes mellitus with hyperglycemia: Secondary | ICD-10-CM | POA: Diagnosis not present

## 2017-10-15 DIAGNOSIS — M25562 Pain in left knee: Secondary | ICD-10-CM | POA: Diagnosis not present

## 2017-10-15 DIAGNOSIS — E782 Mixed hyperlipidemia: Secondary | ICD-10-CM | POA: Diagnosis not present

## 2017-10-15 DIAGNOSIS — I10 Essential (primary) hypertension: Secondary | ICD-10-CM | POA: Diagnosis not present

## 2017-10-15 DIAGNOSIS — N401 Enlarged prostate with lower urinary tract symptoms: Secondary | ICD-10-CM | POA: Diagnosis not present

## 2017-11-16 DIAGNOSIS — Z6823 Body mass index (BMI) 23.0-23.9, adult: Secondary | ICD-10-CM | POA: Diagnosis not present

## 2017-11-16 DIAGNOSIS — I10 Essential (primary) hypertension: Secondary | ICD-10-CM | POA: Diagnosis not present

## 2017-11-16 DIAGNOSIS — H81399 Other peripheral vertigo, unspecified ear: Secondary | ICD-10-CM | POA: Diagnosis not present

## 2017-11-16 DIAGNOSIS — M25562 Pain in left knee: Secondary | ICD-10-CM | POA: Diagnosis not present

## 2017-11-16 DIAGNOSIS — E782 Mixed hyperlipidemia: Secondary | ICD-10-CM | POA: Diagnosis not present

## 2017-11-16 DIAGNOSIS — N401 Enlarged prostate with lower urinary tract symptoms: Secondary | ICD-10-CM | POA: Diagnosis not present

## 2017-11-16 DIAGNOSIS — E1165 Type 2 diabetes mellitus with hyperglycemia: Secondary | ICD-10-CM | POA: Diagnosis not present

## 2017-12-19 DIAGNOSIS — E1165 Type 2 diabetes mellitus with hyperglycemia: Secondary | ICD-10-CM | POA: Diagnosis not present

## 2017-12-19 DIAGNOSIS — E782 Mixed hyperlipidemia: Secondary | ICD-10-CM | POA: Diagnosis not present

## 2017-12-19 DIAGNOSIS — I10 Essential (primary) hypertension: Secondary | ICD-10-CM | POA: Diagnosis not present

## 2018-02-06 DIAGNOSIS — I10 Essential (primary) hypertension: Secondary | ICD-10-CM | POA: Diagnosis not present

## 2018-02-06 DIAGNOSIS — E1165 Type 2 diabetes mellitus with hyperglycemia: Secondary | ICD-10-CM | POA: Diagnosis not present

## 2018-02-06 DIAGNOSIS — E782 Mixed hyperlipidemia: Secondary | ICD-10-CM | POA: Diagnosis not present

## 2018-03-22 DIAGNOSIS — E782 Mixed hyperlipidemia: Secondary | ICD-10-CM | POA: Diagnosis not present

## 2018-03-22 DIAGNOSIS — E1165 Type 2 diabetes mellitus with hyperglycemia: Secondary | ICD-10-CM | POA: Diagnosis not present

## 2018-03-22 DIAGNOSIS — I10 Essential (primary) hypertension: Secondary | ICD-10-CM | POA: Diagnosis not present

## 2018-03-29 DIAGNOSIS — Z0001 Encounter for general adult medical examination with abnormal findings: Secondary | ICD-10-CM | POA: Diagnosis not present

## 2018-03-29 DIAGNOSIS — Z23 Encounter for immunization: Secondary | ICD-10-CM | POA: Diagnosis not present

## 2018-03-29 DIAGNOSIS — M25562 Pain in left knee: Secondary | ICD-10-CM | POA: Diagnosis not present

## 2018-03-29 DIAGNOSIS — E119 Type 2 diabetes mellitus without complications: Secondary | ICD-10-CM | POA: Diagnosis not present

## 2018-03-29 DIAGNOSIS — H538 Other visual disturbances: Secondary | ICD-10-CM | POA: Diagnosis not present

## 2018-03-29 DIAGNOSIS — N402 Nodular prostate without lower urinary tract symptoms: Secondary | ICD-10-CM | POA: Diagnosis not present

## 2018-03-29 DIAGNOSIS — E782 Mixed hyperlipidemia: Secondary | ICD-10-CM | POA: Diagnosis not present

## 2018-03-29 DIAGNOSIS — I10 Essential (primary) hypertension: Secondary | ICD-10-CM | POA: Diagnosis not present

## 2018-08-22 DIAGNOSIS — Z0000 Encounter for general adult medical examination without abnormal findings: Secondary | ICD-10-CM | POA: Diagnosis not present

## 2018-08-23 DIAGNOSIS — I10 Essential (primary) hypertension: Secondary | ICD-10-CM | POA: Diagnosis not present

## 2018-08-23 DIAGNOSIS — E1169 Type 2 diabetes mellitus with other specified complication: Secondary | ICD-10-CM | POA: Diagnosis not present

## 2018-08-23 DIAGNOSIS — E782 Mixed hyperlipidemia: Secondary | ICD-10-CM | POA: Diagnosis not present

## 2018-08-23 DIAGNOSIS — H538 Other visual disturbances: Secondary | ICD-10-CM | POA: Diagnosis not present

## 2018-08-23 DIAGNOSIS — M25562 Pain in left knee: Secondary | ICD-10-CM | POA: Diagnosis not present

## 2018-08-23 DIAGNOSIS — N401 Enlarged prostate with lower urinary tract symptoms: Secondary | ICD-10-CM | POA: Diagnosis not present

## 2018-08-23 DIAGNOSIS — M545 Low back pain: Secondary | ICD-10-CM | POA: Diagnosis not present

## 2018-08-24 DIAGNOSIS — I10 Essential (primary) hypertension: Secondary | ICD-10-CM | POA: Diagnosis not present

## 2018-08-24 DIAGNOSIS — E1165 Type 2 diabetes mellitus with hyperglycemia: Secondary | ICD-10-CM | POA: Diagnosis not present

## 2018-08-24 DIAGNOSIS — E782 Mixed hyperlipidemia: Secondary | ICD-10-CM | POA: Diagnosis not present

## 2018-08-25 DIAGNOSIS — E1165 Type 2 diabetes mellitus with hyperglycemia: Secondary | ICD-10-CM | POA: Diagnosis not present

## 2018-08-25 DIAGNOSIS — E782 Mixed hyperlipidemia: Secondary | ICD-10-CM | POA: Diagnosis not present

## 2018-08-25 DIAGNOSIS — I10 Essential (primary) hypertension: Secondary | ICD-10-CM | POA: Diagnosis not present

## 2018-08-29 DIAGNOSIS — T63311A Toxic effect of venom of black widow spider, accidental (unintentional), initial encounter: Secondary | ICD-10-CM | POA: Diagnosis not present

## 2018-10-13 DIAGNOSIS — E1165 Type 2 diabetes mellitus with hyperglycemia: Secondary | ICD-10-CM | POA: Diagnosis not present

## 2018-10-13 DIAGNOSIS — H538 Other visual disturbances: Secondary | ICD-10-CM | POA: Diagnosis not present

## 2018-10-13 DIAGNOSIS — E782 Mixed hyperlipidemia: Secondary | ICD-10-CM | POA: Diagnosis not present

## 2018-10-13 DIAGNOSIS — H81399 Other peripheral vertigo, unspecified ear: Secondary | ICD-10-CM | POA: Diagnosis not present

## 2018-10-13 DIAGNOSIS — N402 Nodular prostate without lower urinary tract symptoms: Secondary | ICD-10-CM | POA: Diagnosis not present

## 2018-10-13 DIAGNOSIS — E119 Type 2 diabetes mellitus without complications: Secondary | ICD-10-CM | POA: Diagnosis not present

## 2018-10-13 DIAGNOSIS — Z0001 Encounter for general adult medical examination with abnormal findings: Secondary | ICD-10-CM | POA: Diagnosis not present

## 2018-10-13 DIAGNOSIS — Z0000 Encounter for general adult medical examination without abnormal findings: Secondary | ICD-10-CM | POA: Diagnosis not present

## 2018-10-13 DIAGNOSIS — I10 Essential (primary) hypertension: Secondary | ICD-10-CM | POA: Diagnosis not present

## 2018-10-13 DIAGNOSIS — Z23 Encounter for immunization: Secondary | ICD-10-CM | POA: Diagnosis not present

## 2018-10-18 DIAGNOSIS — I10 Essential (primary) hypertension: Secondary | ICD-10-CM | POA: Diagnosis not present

## 2018-10-18 DIAGNOSIS — E119 Type 2 diabetes mellitus without complications: Secondary | ICD-10-CM | POA: Diagnosis not present

## 2018-10-18 DIAGNOSIS — M25562 Pain in left knee: Secondary | ICD-10-CM | POA: Diagnosis not present

## 2018-10-18 DIAGNOSIS — N402 Nodular prostate without lower urinary tract symptoms: Secondary | ICD-10-CM | POA: Diagnosis not present

## 2018-10-18 DIAGNOSIS — E782 Mixed hyperlipidemia: Secondary | ICD-10-CM | POA: Diagnosis not present

## 2018-10-18 DIAGNOSIS — H538 Other visual disturbances: Secondary | ICD-10-CM | POA: Diagnosis not present

## 2018-12-07 DIAGNOSIS — E1165 Type 2 diabetes mellitus with hyperglycemia: Secondary | ICD-10-CM | POA: Diagnosis not present

## 2018-12-07 DIAGNOSIS — I10 Essential (primary) hypertension: Secondary | ICD-10-CM | POA: Diagnosis not present

## 2018-12-07 DIAGNOSIS — E782 Mixed hyperlipidemia: Secondary | ICD-10-CM | POA: Diagnosis not present

## 2019-01-10 DIAGNOSIS — H5200 Hypermetropia, unspecified eye: Secondary | ICD-10-CM | POA: Diagnosis not present

## 2019-01-19 DIAGNOSIS — E119 Type 2 diabetes mellitus without complications: Secondary | ICD-10-CM | POA: Diagnosis not present

## 2019-01-19 DIAGNOSIS — I10 Essential (primary) hypertension: Secondary | ICD-10-CM | POA: Diagnosis not present

## 2019-01-19 DIAGNOSIS — E1165 Type 2 diabetes mellitus with hyperglycemia: Secondary | ICD-10-CM | POA: Diagnosis not present

## 2019-01-19 DIAGNOSIS — E782 Mixed hyperlipidemia: Secondary | ICD-10-CM | POA: Diagnosis not present

## 2019-01-19 DIAGNOSIS — E1169 Type 2 diabetes mellitus with other specified complication: Secondary | ICD-10-CM | POA: Diagnosis not present

## 2019-01-23 DIAGNOSIS — I10 Essential (primary) hypertension: Secondary | ICD-10-CM | POA: Diagnosis not present

## 2019-01-23 DIAGNOSIS — E119 Type 2 diabetes mellitus without complications: Secondary | ICD-10-CM | POA: Diagnosis not present

## 2019-01-23 DIAGNOSIS — F17220 Nicotine dependence, chewing tobacco, uncomplicated: Secondary | ICD-10-CM | POA: Diagnosis not present

## 2019-01-23 DIAGNOSIS — N402 Nodular prostate without lower urinary tract symptoms: Secondary | ICD-10-CM | POA: Diagnosis not present

## 2019-01-23 DIAGNOSIS — E782 Mixed hyperlipidemia: Secondary | ICD-10-CM | POA: Diagnosis not present

## 2019-01-23 DIAGNOSIS — M25562 Pain in left knee: Secondary | ICD-10-CM | POA: Diagnosis not present

## 2019-02-16 DIAGNOSIS — M25562 Pain in left knee: Secondary | ICD-10-CM | POA: Diagnosis not present

## 2019-02-16 DIAGNOSIS — N402 Nodular prostate without lower urinary tract symptoms: Secondary | ICD-10-CM | POA: Diagnosis not present

## 2019-02-16 DIAGNOSIS — F17220 Nicotine dependence, chewing tobacco, uncomplicated: Secondary | ICD-10-CM | POA: Diagnosis not present

## 2019-02-16 DIAGNOSIS — I10 Essential (primary) hypertension: Secondary | ICD-10-CM | POA: Diagnosis not present

## 2019-02-16 DIAGNOSIS — E782 Mixed hyperlipidemia: Secondary | ICD-10-CM | POA: Diagnosis not present

## 2019-02-16 DIAGNOSIS — E119 Type 2 diabetes mellitus without complications: Secondary | ICD-10-CM | POA: Diagnosis not present

## 2019-03-16 DIAGNOSIS — I10 Essential (primary) hypertension: Secondary | ICD-10-CM | POA: Diagnosis not present

## 2019-03-16 DIAGNOSIS — M25562 Pain in left knee: Secondary | ICD-10-CM | POA: Diagnosis not present

## 2019-03-16 DIAGNOSIS — F17220 Nicotine dependence, chewing tobacco, uncomplicated: Secondary | ICD-10-CM | POA: Diagnosis not present

## 2019-03-16 DIAGNOSIS — E782 Mixed hyperlipidemia: Secondary | ICD-10-CM | POA: Diagnosis not present

## 2019-03-16 DIAGNOSIS — N402 Nodular prostate without lower urinary tract symptoms: Secondary | ICD-10-CM | POA: Diagnosis not present

## 2019-03-16 DIAGNOSIS — E119 Type 2 diabetes mellitus without complications: Secondary | ICD-10-CM | POA: Diagnosis not present

## 2019-04-04 DIAGNOSIS — I10 Essential (primary) hypertension: Secondary | ICD-10-CM | POA: Diagnosis not present

## 2019-04-04 DIAGNOSIS — E1165 Type 2 diabetes mellitus with hyperglycemia: Secondary | ICD-10-CM | POA: Diagnosis not present

## 2019-04-04 DIAGNOSIS — E7849 Other hyperlipidemia: Secondary | ICD-10-CM | POA: Diagnosis not present

## 2019-04-29 ENCOUNTER — Emergency Department (HOSPITAL_COMMUNITY)
Admission: EM | Admit: 2019-04-29 | Discharge: 2019-04-29 | Disposition: A | Discharge status: Home / Self Care | Payer: Medicare HMO | Attending: Emergency Medicine | Admitting: Emergency Medicine

## 2019-04-29 ENCOUNTER — Encounter (HOSPITAL_COMMUNITY): Payer: Self-pay | Admitting: Emergency Medicine

## 2019-04-29 DIAGNOSIS — Z7984 Long term (current) use of oral hypoglycemic drugs: Secondary | ICD-10-CM | POA: Insufficient documentation

## 2019-04-29 DIAGNOSIS — E119 Type 2 diabetes mellitus without complications: Secondary | ICD-10-CM | POA: Insufficient documentation

## 2019-04-29 DIAGNOSIS — I10 Essential (primary) hypertension: Secondary | ICD-10-CM | POA: Diagnosis not present

## 2019-04-29 DIAGNOSIS — Z79899 Other long term (current) drug therapy: Secondary | ICD-10-CM | POA: Diagnosis not present

## 2019-04-29 LAB — BASIC METABOLIC PANEL
Anion gap: 7 (ref 5–15)
BUN: 26 mg/dL — ABNORMAL HIGH (ref 8–23)
CO2: 26 mmol/L (ref 22–32)
Calcium: 9.2 mg/dL (ref 8.9–10.3)
Chloride: 104 mmol/L (ref 98–111)
Creatinine, Ser: 0.93 mg/dL (ref 0.61–1.24)
GFR calc Af Amer: >60 mL/min (ref >60)
GFR calc non Af Amer: >60 mL/min (ref >60)
Glucose, Bld: 145 mg/dL — ABNORMAL HIGH (ref 70–99)
Potassium: 4.5 mmol/L (ref 3.5–5.1)
Sodium: 137 mmol/L (ref 135–145)

## 2019-04-29 LAB — CBC
HCT: 39.7 % (ref 39.0–52.0)
Hemoglobin: 13.1 g/dL (ref 13.0–17.0)
MCH: 30.8 pg (ref 26.0–34.0)
MCHC: 33 g/dL (ref 30.0–36.0)
MCV: 93.4 fL (ref 80.0–100.0)
Platelets: 162 10*3/uL (ref 150–400)
RBC: 4.25 MIL/uL (ref 4.22–5.81)
RDW: 13.5 % (ref 11.5–15.5)
WBC: 7.2 10*3/uL (ref 4.0–10.5)
nRBC: 0 % (ref 0.0–0.2)

## 2019-04-29 NOTE — Discharge Instructions (Signed)
Please follow up with your primary care provider for recheck of your blood pressure.  Your labs are normal today.

## 2019-04-29 NOTE — ED Triage Notes (Signed)
Patient reports HTN with dizziness X3 days. Patient had a pressure of 171/72 at home. Patient reports he took a [place] 50 mg at 1300. Denies N/V/D or pain.

## 2019-04-29 NOTE — ED Provider Notes (Signed)
[facility] EMERGENCY DEPARTMENT Provider Note   CSN: [id number] Arrival date & time: 04/29/19  1337     History Chief Complaint  Patient presents with  . Hypertension    Colton Johnson is a 79 y.o. male.  The history is provided by the patient. No language interpreter was used.  Hypertension       79 year old male with history of hypertension currently on atenolol, diabetes, hypercholesterolemia presenting complaining of elevated blood pressure.  Patient reports he normally checks his blood pressure every other day.  For the past 2 days he checked his blood pressure and noticed that it is higher than his usual.  His blood pressure normally is between 130-140 systolic but lately it has been in the 170 systolic.  Today he woke up late, check his blood pressure and states that it was 170s.  He then took his normal blood pressure medication and decided to come to the ER for further evaluation.  He denies having any active pain.  He did report some mild intermittent lightheadedness sometimes brought on while walking.  Does not happen all the time.  No room spinning sensation, no severe headache, neck pain, chest pain, trouble breathing, focal numbness or focal weakness or increased confusion.  No recent medication changes.  No dietary changes.  Past Medical History:  Diagnosis Date  . Diabetes mellitus without complication (HCC)   . Hypercholesterolemia   . Hypertension     There are no problems to display for this patient.   Past Surgical History:  Procedure Laterality Date  . BACK SURGERY         Family History  Problem Relation Age of Onset  . Diabetes Mother   . Heart disease Father   . Cancer Brother     Social History   Tobacco Use  . Smoking status: Never Smoker  . Smokeless tobacco: Current User    Types: Chew  Substance Use Topics  . Alcohol use: No  . Drug use: No    Home Medications Prior to Admission medications   Medication Sig Start Date End  Date Taking? Authorizing Provider  atenolol (TENORMIN) 25 MG tablet Take 25 mg by mouth daily.    [provider]  meclizine (ANTIVERT) 25 MG tablet Take 1 tablet (25 mg total) by mouth 3 (three) times daily as needed for dizziness. 05/11/17   Bethann Berkshire, MD  metFORMIN (GLUCOPHAGE) 500 MG tablet Take 500 mg by mouth 3 (three) times daily.    [provider]  ondansetron (ZOFRAN ODT) 4 MG disintegrating tablet 4mg  ODT q4 hours prn nausea/vomit 05/11/17   05/13/17, MD  simvastatin (ZOCOR) 40 MG tablet Take 40 mg by mouth daily.    [provider]  tamsulosin (FLOMAX) 0.4 MG CAPS capsule Take 0.4 mg by mouth daily.    [provider]  triamterene-hydrochlorothiazide (DYAZIDE) 37.5-25 MG capsule Take 1 capsule by mouth daily.    [provider]    Allergies    Aleve [naproxen sodium] and Other  Review of Systems   Review of Systems  All other systems reviewed and are negative.   Physical Exam Updated Vital Signs BP (!) 149/69   Pulse 65   Temp 98.1 F (36.7 C) (Oral)   Resp 16   Ht 6' (1.829 m)   Wt 80.7 kg   SpO2 100%   BMI 24.14 kg/m   Physical Exam Vitals and nursing note reviewed.  Constitutional:      General: He is  not in acute distress.    Appearance: He is well-developed.  HENT:     Head: Atraumatic.  Eyes:     Conjunctiva/sclera: Conjunctivae normal.  Cardiovascular:     Rate and Rhythm: Normal rate and regular rhythm.     Pulses: Normal pulses.     Heart sounds: Normal heart sounds.  Pulmonary:     Effort: Pulmonary effort is normal.     Breath sounds: Normal breath sounds.  Abdominal:     Palpations: Abdomen is soft.  Musculoskeletal:     Cervical back: Normal range of motion and neck supple. No rigidity.  Skin:    Findings: No rash.  Neurological:     Mental Status: He is alert and oriented to person, place, and time.     GCS: GCS eye subscore is 4. GCS verbal subscore is 5. GCS motor subscore is 6.      Cranial Nerves: Cranial nerves are intact.     Sensory: Sensation is intact.     Motor: Motor function is intact.     Coordination: Coordination is intact.     Gait: Gait is intact.     ED Results / Procedures / Treatments   Labs (all labs ordered are listed, but only abnormal results are displayed) Labs Reviewed  BASIC METABOLIC PANEL - Abnormal; Notable for the following components:      Result Value   Glucose, Bld 145 (*)    BUN 26 (*)    All other components within normal limits  CBC  I-STAT CHEM 8, ED    EKG EKG Interpretation  Date/Time:  Sunday April 29 2019 17:04:40 EST Ventricular Rate:  60 PR Interval:    QRS Duration: 97 QT Interval:  403 QTC Calculation: 403 R Axis:   44 Text Interpretation: Sinus rhythm Premature atrial complexes Artifact Baseline wander Abnormal ECG Confirmed by Carmin Muskrat [phone]) on 04/29/2019 5:08:59 PM   Radiology No results found.  Procedures Procedures (including critical care time)  Medications Ordered in ED Medications - No data to display  ED Course  I have reviewed the triage vital signs and the nursing notes.  Pertinent labs & imaging results that were available during my care of the patient were reviewed by me and considered in my medical decision making (see chart for details).    MDM Rules/Calculators/A&P                      BP (!) 148/86   Pulse 65   Temp 98.1 F (36.7 C) (Oral)   Resp 16   Ht 6' (1.829 m)   Wt 80.7 kg   SpO2 100%   BMI 24.14 kg/m   Final Clinical Impression(s) / ED Diagnoses Final diagnoses:  Essential hypertension    Rx / DC Orders ED Discharge Orders    None     4:20 PM Patient here with concerns of high blood pressure.  He was concerned because his blood pressure has been in the 809 systolic for the past 2 days.  He took his usual blood pressure medication and came here.  Current blood pressure is 149/69.  Patient reports some mild lightheadedness however.  7:59  PM Labs are reassuring, normal EKG.  BP normalized on recheck without intervention.  Pt stable for discharge.  Care discussed with Dr. Vanita Panda.  Recommend f/u witih PCP   Domenic Moras, PA-C 04/29/19 1959    Carmin Muskrat, MD 05/01/19 1034

## 2019-05-03 DIAGNOSIS — I10 Essential (primary) hypertension: Secondary | ICD-10-CM | POA: Diagnosis not present

## 2019-05-03 DIAGNOSIS — E119 Type 2 diabetes mellitus without complications: Secondary | ICD-10-CM | POA: Diagnosis not present

## 2019-05-03 DIAGNOSIS — E1165 Type 2 diabetes mellitus with hyperglycemia: Secondary | ICD-10-CM | POA: Diagnosis not present

## 2019-05-03 DIAGNOSIS — E1169 Type 2 diabetes mellitus with other specified complication: Secondary | ICD-10-CM | POA: Diagnosis not present

## 2019-05-03 DIAGNOSIS — E782 Mixed hyperlipidemia: Secondary | ICD-10-CM | POA: Diagnosis not present

## 2019-05-08 DIAGNOSIS — I10 Essential (primary) hypertension: Secondary | ICD-10-CM | POA: Diagnosis not present

## 2019-05-08 DIAGNOSIS — M5442 Lumbago with sciatica, left side: Secondary | ICD-10-CM | POA: Diagnosis not present

## 2019-05-08 DIAGNOSIS — H814 Vertigo of central origin: Secondary | ICD-10-CM | POA: Diagnosis not present

## 2019-05-08 DIAGNOSIS — M25562 Pain in left knee: Secondary | ICD-10-CM | POA: Diagnosis not present

## 2019-05-08 DIAGNOSIS — Z0001 Encounter for general adult medical examination with abnormal findings: Secondary | ICD-10-CM | POA: Diagnosis not present

## 2019-05-08 DIAGNOSIS — Z23 Encounter for immunization: Secondary | ICD-10-CM | POA: Diagnosis not present

## 2019-05-08 DIAGNOSIS — E119 Type 2 diabetes mellitus without complications: Secondary | ICD-10-CM | POA: Diagnosis not present

## 2019-05-08 DIAGNOSIS — B351 Tinea unguium: Secondary | ICD-10-CM | POA: Diagnosis not present

## 2019-05-08 DIAGNOSIS — H81399 Other peripheral vertigo, unspecified ear: Secondary | ICD-10-CM | POA: Diagnosis not present

## 2019-05-08 DIAGNOSIS — E782 Mixed hyperlipidemia: Secondary | ICD-10-CM | POA: Diagnosis not present

## 2019-05-08 DIAGNOSIS — N402 Nodular prostate without lower urinary tract symptoms: Secondary | ICD-10-CM | POA: Diagnosis not present

## 2019-05-08 DIAGNOSIS — F17220 Nicotine dependence, chewing tobacco, uncomplicated: Secondary | ICD-10-CM | POA: Diagnosis not present

## 2019-05-08 DIAGNOSIS — Z0000 Encounter for general adult medical examination without abnormal findings: Secondary | ICD-10-CM | POA: Diagnosis not present

## 2019-05-08 DIAGNOSIS — T63311A Toxic effect of venom of black widow spider, accidental (unintentional), initial encounter: Secondary | ICD-10-CM | POA: Diagnosis not present

## 2019-05-22 DIAGNOSIS — I10 Essential (primary) hypertension: Secondary | ICD-10-CM | POA: Diagnosis not present

## 2019-05-22 DIAGNOSIS — M5442 Lumbago with sciatica, left side: Secondary | ICD-10-CM | POA: Diagnosis not present

## 2019-05-22 DIAGNOSIS — R079 Chest pain, unspecified: Secondary | ICD-10-CM | POA: Diagnosis not present

## 2019-05-23 DIAGNOSIS — E782 Mixed hyperlipidemia: Secondary | ICD-10-CM | POA: Diagnosis not present

## 2019-05-23 DIAGNOSIS — B351 Tinea unguium: Secondary | ICD-10-CM | POA: Diagnosis not present

## 2019-05-23 DIAGNOSIS — M5442 Lumbago with sciatica, left side: Secondary | ICD-10-CM | POA: Diagnosis not present

## 2019-05-23 DIAGNOSIS — M25562 Pain in left knee: Secondary | ICD-10-CM | POA: Diagnosis not present

## 2019-05-23 DIAGNOSIS — N402 Nodular prostate without lower urinary tract symptoms: Secondary | ICD-10-CM | POA: Diagnosis not present

## 2019-05-23 DIAGNOSIS — F17220 Nicotine dependence, chewing tobacco, uncomplicated: Secondary | ICD-10-CM | POA: Diagnosis not present

## 2019-05-23 DIAGNOSIS — E119 Type 2 diabetes mellitus without complications: Secondary | ICD-10-CM | POA: Diagnosis not present

## 2019-05-23 DIAGNOSIS — I10 Essential (primary) hypertension: Secondary | ICD-10-CM | POA: Diagnosis not present

## 2019-06-05 DIAGNOSIS — I10 Essential (primary) hypertension: Secondary | ICD-10-CM | POA: Diagnosis not present

## 2019-06-05 DIAGNOSIS — M5442 Lumbago with sciatica, left side: Secondary | ICD-10-CM | POA: Diagnosis not present

## 2019-06-05 DIAGNOSIS — R079 Chest pain, unspecified: Secondary | ICD-10-CM | POA: Diagnosis not present

## 2019-06-05 DIAGNOSIS — K219 Gastro-esophageal reflux disease without esophagitis: Secondary | ICD-10-CM | POA: Diagnosis not present

## 2019-06-07 DIAGNOSIS — I10 Essential (primary) hypertension: Secondary | ICD-10-CM | POA: Diagnosis not present

## 2019-06-07 DIAGNOSIS — E7849 Other hyperlipidemia: Secondary | ICD-10-CM | POA: Diagnosis not present

## 2019-06-07 DIAGNOSIS — E1165 Type 2 diabetes mellitus with hyperglycemia: Secondary | ICD-10-CM | POA: Diagnosis not present

## 2019-06-18 DIAGNOSIS — M5416 Radiculopathy, lumbar region: Secondary | ICD-10-CM | POA: Diagnosis not present

## 2019-06-18 DIAGNOSIS — M545 Low back pain: Secondary | ICD-10-CM | POA: Diagnosis not present

## 2019-06-18 DIAGNOSIS — Z9889 Other specified postprocedural states: Secondary | ICD-10-CM | POA: Diagnosis not present

## 2019-06-26 DIAGNOSIS — M545 Low back pain: Secondary | ICD-10-CM | POA: Diagnosis not present

## 2019-07-02 DIAGNOSIS — M5136 Other intervertebral disc degeneration, lumbar region: Secondary | ICD-10-CM | POA: Diagnosis not present

## 2019-07-02 DIAGNOSIS — M4310 Spondylolisthesis, site unspecified: Secondary | ICD-10-CM | POA: Diagnosis not present

## 2019-07-02 DIAGNOSIS — M961 Postlaminectomy syndrome, not elsewhere classified: Secondary | ICD-10-CM | POA: Diagnosis not present

## 2019-07-09 DIAGNOSIS — M5136 Other intervertebral disc degeneration, lumbar region: Secondary | ICD-10-CM | POA: Diagnosis not present

## 2019-07-09 DIAGNOSIS — N281 Cyst of kidney, acquired: Secondary | ICD-10-CM | POA: Diagnosis not present

## 2019-07-09 DIAGNOSIS — F17221 Nicotine dependence, chewing tobacco, in remission: Secondary | ICD-10-CM | POA: Diagnosis not present

## 2019-07-09 DIAGNOSIS — M961 Postlaminectomy syndrome, not elsewhere classified: Secondary | ICD-10-CM | POA: Diagnosis not present

## 2019-07-14 ENCOUNTER — Other Ambulatory Visit: Payer: Self-pay

## 2019-07-14 ENCOUNTER — Encounter (HOSPITAL_COMMUNITY): Payer: Self-pay | Admitting: Emergency Medicine

## 2019-07-14 ENCOUNTER — Emergency Department (HOSPITAL_COMMUNITY)
Admission: EM | Admit: 2019-07-14 | Discharge: 2019-07-14 | Disposition: A | Discharge status: Home / Self Care | Payer: Medicare HMO | Attending: Emergency Medicine | Admitting: Emergency Medicine

## 2019-07-14 DIAGNOSIS — E119 Type 2 diabetes mellitus without complications: Secondary | ICD-10-CM | POA: Diagnosis not present

## 2019-07-14 DIAGNOSIS — M545 Low back pain, unspecified: Secondary | ICD-10-CM

## 2019-07-14 DIAGNOSIS — I10 Essential (primary) hypertension: Secondary | ICD-10-CM | POA: Insufficient documentation

## 2019-07-14 DIAGNOSIS — Z7984 Long term (current) use of oral hypoglycemic drugs: Secondary | ICD-10-CM | POA: Insufficient documentation

## 2019-07-14 DIAGNOSIS — F17220 Nicotine dependence, chewing tobacco, uncomplicated: Secondary | ICD-10-CM | POA: Diagnosis not present

## 2019-07-14 DIAGNOSIS — Z79899 Other long term (current) drug therapy: Secondary | ICD-10-CM | POA: Insufficient documentation

## 2019-07-14 MED ORDER — DIAZEPAM 2 MG PO TABS: 2.0000 mg | ORAL_TABLET | Freq: Three times a day (TID) | ORAL | 0 refills | Status: DC | PRN

## 2019-07-14 MED ORDER — DIAZEPAM 5 MG PO TABS
5.0000 mg | ORAL_TABLET | Freq: Once | ORAL | Status: AC
  Administered 2019-07-14: 14:00:00 5 mg via ORAL
  Filled 2019-07-14: qty 1

## 2019-07-14 NOTE — ED Provider Notes (Signed)
[facility] EMERGENCY DEPARTMENT Provider Note   CSN: [id number] Arrival date & time: 07/14/19  1328     History Chief Complaint  Patient presents with  . Back Pain    Colton Johnson is a 79 y.o. male.  79 year old male presents with worsening chronic back pain.  Is scheduled to have an epidural injection in 3 days.  Has been using tramadol with temporary relief.  Pain is localized to his lower back and not associate with bowel or bladder dysfunction.  Notes that Colton Johnson has not had any urinary symptoms.  Symptoms better with remaining still        Past Medical History:  Diagnosis Date  . Diabetes mellitus without complication (HCC)   . Hypercholesterolemia   . Hypertension     There are no problems to display for this patient.   Past Surgical History:  Procedure Laterality Date  . BACK SURGERY         Family History  Problem Relation Age of Onset  . Diabetes Mother   . Heart disease Father   . Cancer Brother     Social History   Tobacco Use  . Smoking status: Never Smoker  . Smokeless tobacco: Current User    Types: Chew  Substance Use Topics  . Alcohol use: No  . Drug use: No    Home Medications Prior to Admission medications   Medication Sig Start Date End Date Taking? Authorizing Provider  atenolol (TENORMIN) 25 MG tablet Take 25 mg by mouth daily.    [provider]  meclizine (ANTIVERT) 25 MG tablet Take 1 tablet (25 mg total) by mouth 3 (three) times daily as needed for dizziness. 05/11/17   Bethann Berkshire, MD  metFORMIN (GLUCOPHAGE) 500 MG tablet Take 500 mg by mouth 3 (three) times daily.    [provider]  ondansetron (ZOFRAN ODT) 4 MG disintegrating tablet 4mg  ODT q4 hours prn nausea/vomit 05/11/17   05/13/17, MD  simvastatin (ZOCOR) 40 MG tablet Take 40 mg by mouth daily.    [provider]  tamsulosin (FLOMAX) 0.4 MG CAPS capsule Take 0.4 mg by mouth daily.    [provider]    triamterene-hydrochlorothiazide (DYAZIDE) 37.5-25 MG capsule Take 1 capsule by mouth daily.    [provider]    Allergies    Aleve [naproxen sodium] and Other  Review of Systems   Review of Systems  All other systems reviewed and are negative.   Physical Exam Updated Vital Signs BP (!) 141/86 (BP Location: Right Arm)   Pulse 76   Temp 97.8 F (36.6 C) (Oral)   Resp 18   Ht 1.803 m (5\' 11" )   Wt 83.9 kg   SpO2 96%   BMI 25.80 kg/m   Physical Exam Vitals and nursing note reviewed.  Constitutional:      General: Colton Johnson is not in acute distress.    Appearance: Normal appearance. Colton Johnson is well-developed. Colton Johnson is not toxic-appearing.  HENT:     Head: Normocephalic and atraumatic.  Eyes:     General: Lids are normal.     Conjunctiva/sclera: Conjunctivae normal.     Pupils: Pupils are equal, round, and reactive to light.  Neck:     Thyroid: No thyroid mass.     Trachea: No tracheal deviation.  Cardiovascular:     Rate and Rhythm: Normal rate and regular rhythm.     Heart sounds: Normal heart sounds. No murmur. No gallop.   Pulmonary:  Effort: Pulmonary effort is normal. No respiratory distress.     Breath sounds: Normal breath sounds. No stridor. No decreased breath sounds, wheezing, rhonchi or rales.  Abdominal:     General: Bowel sounds are normal. There is no distension.     Palpations: Abdomen is soft.     Tenderness: There is no abdominal tenderness. There is no rebound.  Musculoskeletal:        General: No tenderness. Normal range of motion.     Cervical back: Normal range of motion and neck supple.       Back:  Skin:    General: Skin is warm and dry.     Findings: No abrasion or rash.  Neurological:     Mental Status: Colton Johnson is alert and oriented to person, place, and time.     GCS: GCS eye subscore is 4. GCS verbal subscore is 5. GCS motor subscore is 6.     Cranial Nerves: Cranial nerves are intact. No cranial nerve deficit.     Sensory: No sensory  deficit.     Motor: Motor function is intact. No weakness or tremor.     Deep Tendon Reflexes:     Reflex Scores:      Patellar reflexes are 3+ on the right side and 3+ on the left side.    Comments: Strength 5 of 5 in upper as well as lower extremities  Psychiatric:        Speech: Speech normal.        Behavior: Behavior normal.     ED Results / Procedures / Treatments   Labs (all labs ordered are listed, but only abnormal results are displayed) Labs Reviewed - No data to display  EKG None  Radiology No results found.  Procedures Procedures (including critical care time)  Medications Ordered in ED Medications - No data to display  ED Course  I have reviewed the triage vital signs and the nursing notes.  Pertinent labs & imaging results that were available during my care of the patient were reviewed by me and considered in my medical decision making (see chart for details).    MDM Rules/Calculators/A&P                      Patient has no red flags for his back pain.  No bowel or bladder incontinence.  No perineal numbness or tingling.  Will add Valium to his regimen and patient instructed to follow-up with his doctor on Tuesday for his injection and return precautions given Final Clinical Impression(s) / ED Diagnoses Final diagnoses:  None    Rx / DC Orders ED Discharge Orders    None       Lacretia Leigh, MD 07/14/19 1421

## 2019-07-14 NOTE — ED Notes (Signed)
Patient given discharge instructions patient verbalizes understanding. 

## 2019-07-14 NOTE — ED Triage Notes (Signed)
Pt.stated, Ive had back pain for about 3 weeks. Im suppose to have a spinal shot on the 23rd of this month

## 2019-07-17 DIAGNOSIS — M5136 Other intervertebral disc degeneration, lumbar region: Secondary | ICD-10-CM | POA: Diagnosis not present

## 2019-08-03 DIAGNOSIS — M961 Postlaminectomy syndrome, not elsewhere classified: Secondary | ICD-10-CM | POA: Diagnosis not present

## 2019-08-03 DIAGNOSIS — E119 Type 2 diabetes mellitus without complications: Secondary | ICD-10-CM | POA: Diagnosis not present

## 2019-08-14 DIAGNOSIS — E7849 Other hyperlipidemia: Secondary | ICD-10-CM | POA: Diagnosis not present

## 2019-08-14 DIAGNOSIS — I10 Essential (primary) hypertension: Secondary | ICD-10-CM | POA: Diagnosis not present

## 2019-08-14 DIAGNOSIS — E1165 Type 2 diabetes mellitus with hyperglycemia: Secondary | ICD-10-CM | POA: Diagnosis not present

## 2019-08-18 DIAGNOSIS — H9202 Otalgia, left ear: Secondary | ICD-10-CM | POA: Diagnosis not present

## 2019-09-10 DIAGNOSIS — E1165 Type 2 diabetes mellitus with hyperglycemia: Secondary | ICD-10-CM | POA: Diagnosis not present

## 2019-09-10 DIAGNOSIS — Z0001 Encounter for general adult medical examination with abnormal findings: Secondary | ICD-10-CM | POA: Diagnosis not present

## 2019-09-10 DIAGNOSIS — E1169 Type 2 diabetes mellitus with other specified complication: Secondary | ICD-10-CM | POA: Diagnosis not present

## 2019-09-10 DIAGNOSIS — B351 Tinea unguium: Secondary | ICD-10-CM | POA: Diagnosis not present

## 2019-09-10 DIAGNOSIS — E782 Mixed hyperlipidemia: Secondary | ICD-10-CM | POA: Diagnosis not present

## 2019-09-10 DIAGNOSIS — Z0000 Encounter for general adult medical examination without abnormal findings: Secondary | ICD-10-CM | POA: Diagnosis not present

## 2019-09-10 DIAGNOSIS — F17220 Nicotine dependence, chewing tobacco, uncomplicated: Secondary | ICD-10-CM | POA: Diagnosis not present

## 2019-09-10 DIAGNOSIS — E119 Type 2 diabetes mellitus without complications: Secondary | ICD-10-CM | POA: Diagnosis not present

## 2019-09-10 DIAGNOSIS — E7849 Other hyperlipidemia: Secondary | ICD-10-CM | POA: Diagnosis not present

## 2019-09-13 DIAGNOSIS — M5442 Lumbago with sciatica, left side: Secondary | ICD-10-CM | POA: Diagnosis not present

## 2019-09-13 DIAGNOSIS — E782 Mixed hyperlipidemia: Secondary | ICD-10-CM | POA: Diagnosis not present

## 2019-09-13 DIAGNOSIS — E1165 Type 2 diabetes mellitus with hyperglycemia: Secondary | ICD-10-CM | POA: Diagnosis not present

## 2019-09-13 DIAGNOSIS — M25562 Pain in left knee: Secondary | ICD-10-CM | POA: Diagnosis not present

## 2019-09-13 DIAGNOSIS — N402 Nodular prostate without lower urinary tract symptoms: Secondary | ICD-10-CM | POA: Diagnosis not present

## 2019-09-13 DIAGNOSIS — F17220 Nicotine dependence, chewing tobacco, uncomplicated: Secondary | ICD-10-CM | POA: Diagnosis not present

## 2019-09-13 DIAGNOSIS — I10 Essential (primary) hypertension: Secondary | ICD-10-CM | POA: Diagnosis not present

## 2019-09-13 DIAGNOSIS — Z0001 Encounter for general adult medical examination with abnormal findings: Secondary | ICD-10-CM | POA: Diagnosis not present

## 2019-10-04 DIAGNOSIS — E1165 Type 2 diabetes mellitus with hyperglycemia: Secondary | ICD-10-CM | POA: Diagnosis not present

## 2019-10-04 DIAGNOSIS — I10 Essential (primary) hypertension: Secondary | ICD-10-CM | POA: Diagnosis not present

## 2019-10-04 DIAGNOSIS — E7849 Other hyperlipidemia: Secondary | ICD-10-CM | POA: Diagnosis not present

## 2019-10-30 DIAGNOSIS — E7849 Other hyperlipidemia: Secondary | ICD-10-CM | POA: Diagnosis not present

## 2019-10-30 DIAGNOSIS — I10 Essential (primary) hypertension: Secondary | ICD-10-CM | POA: Diagnosis not present

## 2019-10-30 DIAGNOSIS — E1165 Type 2 diabetes mellitus with hyperglycemia: Secondary | ICD-10-CM | POA: Diagnosis not present

## 2019-11-08 DIAGNOSIS — S00462A Insect bite (nonvenomous) of left ear, initial encounter: Secondary | ICD-10-CM | POA: Diagnosis not present

## 2019-11-08 DIAGNOSIS — E1165 Type 2 diabetes mellitus with hyperglycemia: Secondary | ICD-10-CM | POA: Diagnosis not present

## 2019-11-08 DIAGNOSIS — E782 Mixed hyperlipidemia: Secondary | ICD-10-CM | POA: Diagnosis not present

## 2019-11-08 DIAGNOSIS — I10 Essential (primary) hypertension: Secondary | ICD-10-CM | POA: Diagnosis not present

## 2019-12-13 DIAGNOSIS — S00462A Insect bite (nonvenomous) of left ear, initial encounter: Secondary | ICD-10-CM | POA: Diagnosis not present

## 2019-12-13 DIAGNOSIS — I10 Essential (primary) hypertension: Secondary | ICD-10-CM | POA: Diagnosis not present

## 2019-12-13 DIAGNOSIS — E782 Mixed hyperlipidemia: Secondary | ICD-10-CM | POA: Diagnosis not present

## 2019-12-13 DIAGNOSIS — E1165 Type 2 diabetes mellitus with hyperglycemia: Secondary | ICD-10-CM | POA: Diagnosis not present

## 2020-01-03 IMAGING — DX DG CHEST 2V
2 series · 2 of 2 positions shown · non-contrast
Comparison: Chest x-ray of September 12, 2011

CLINICAL DATA: Awakened this morning with dizziness and vomiting.

EXAM:
CHEST  2 VIEW

[chest pa]
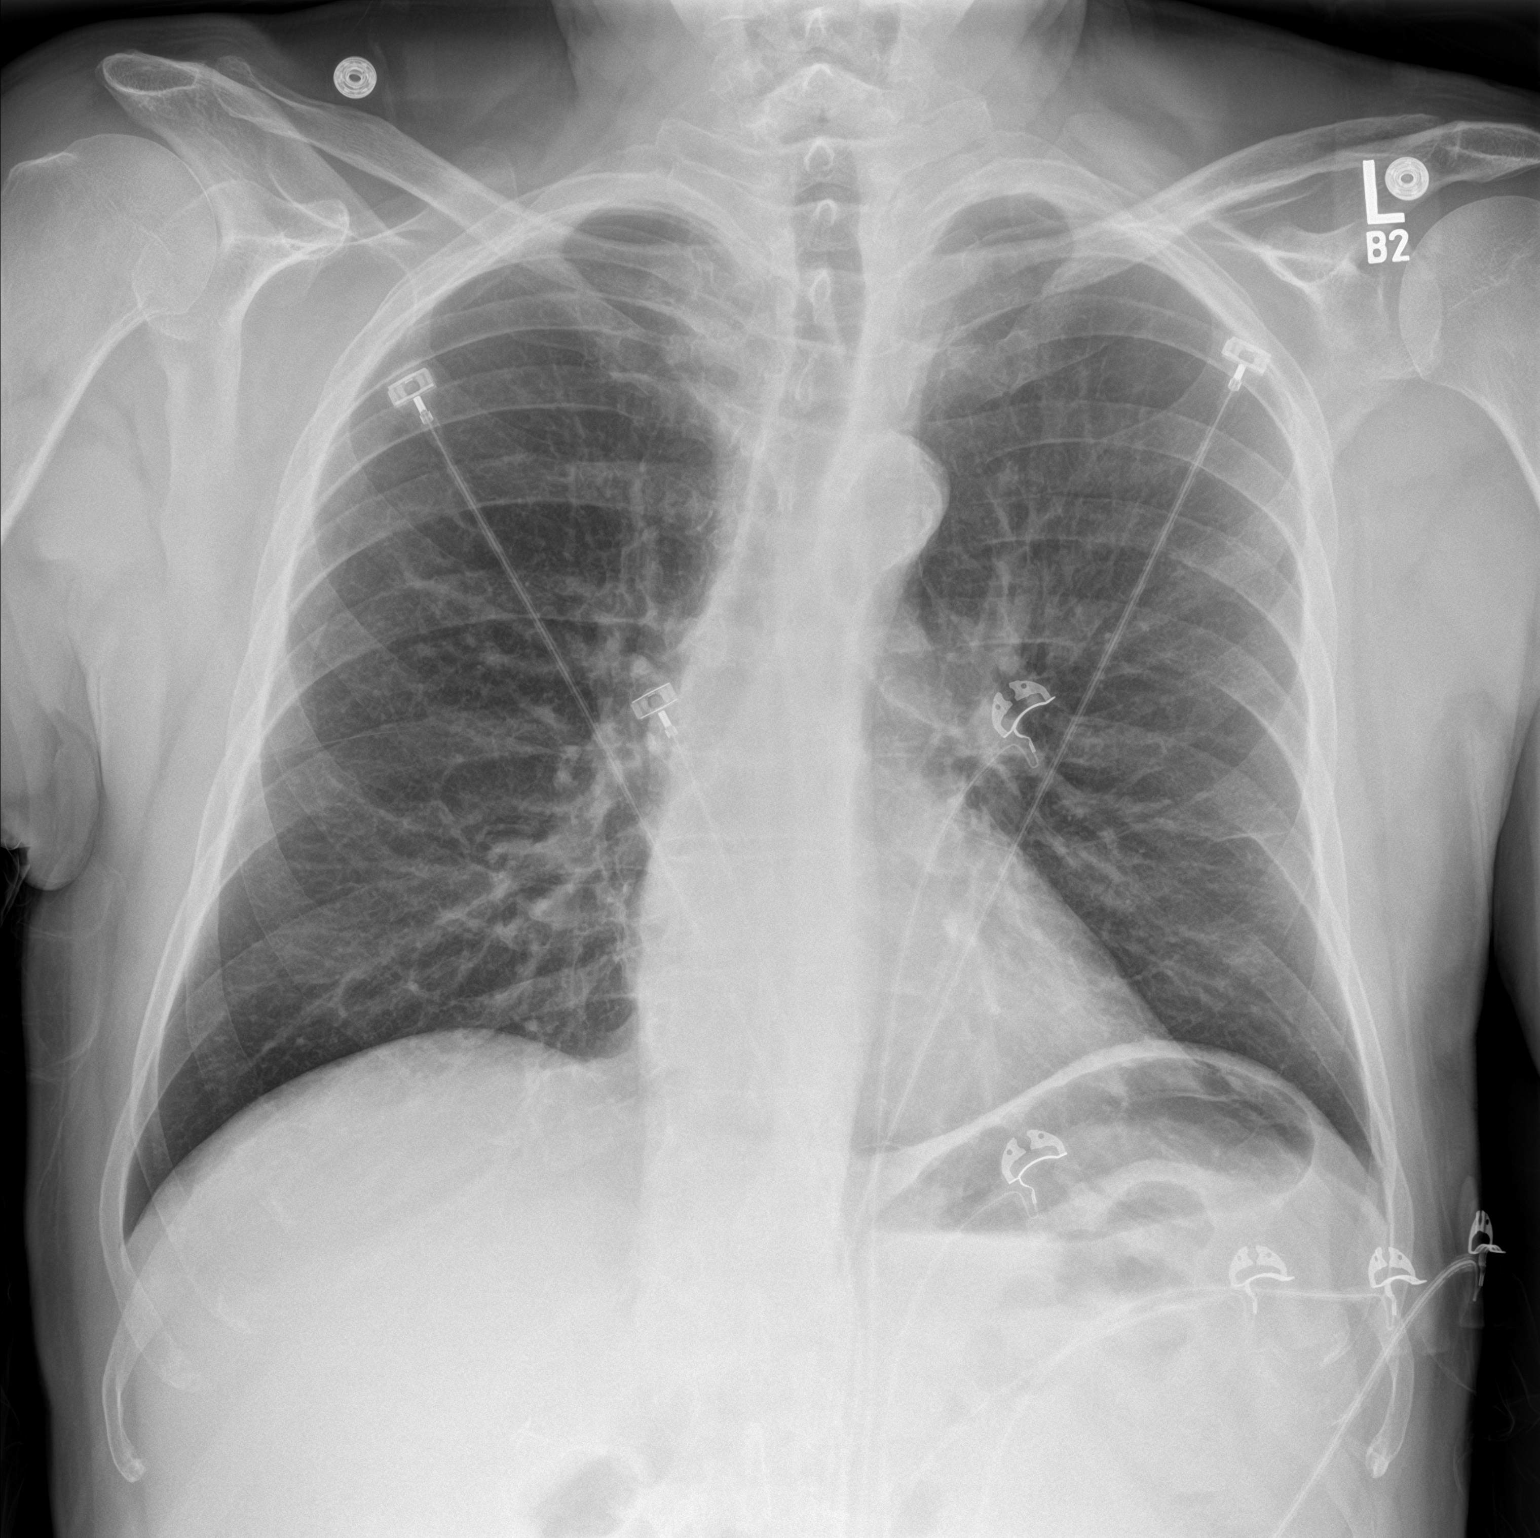

[chest lat]
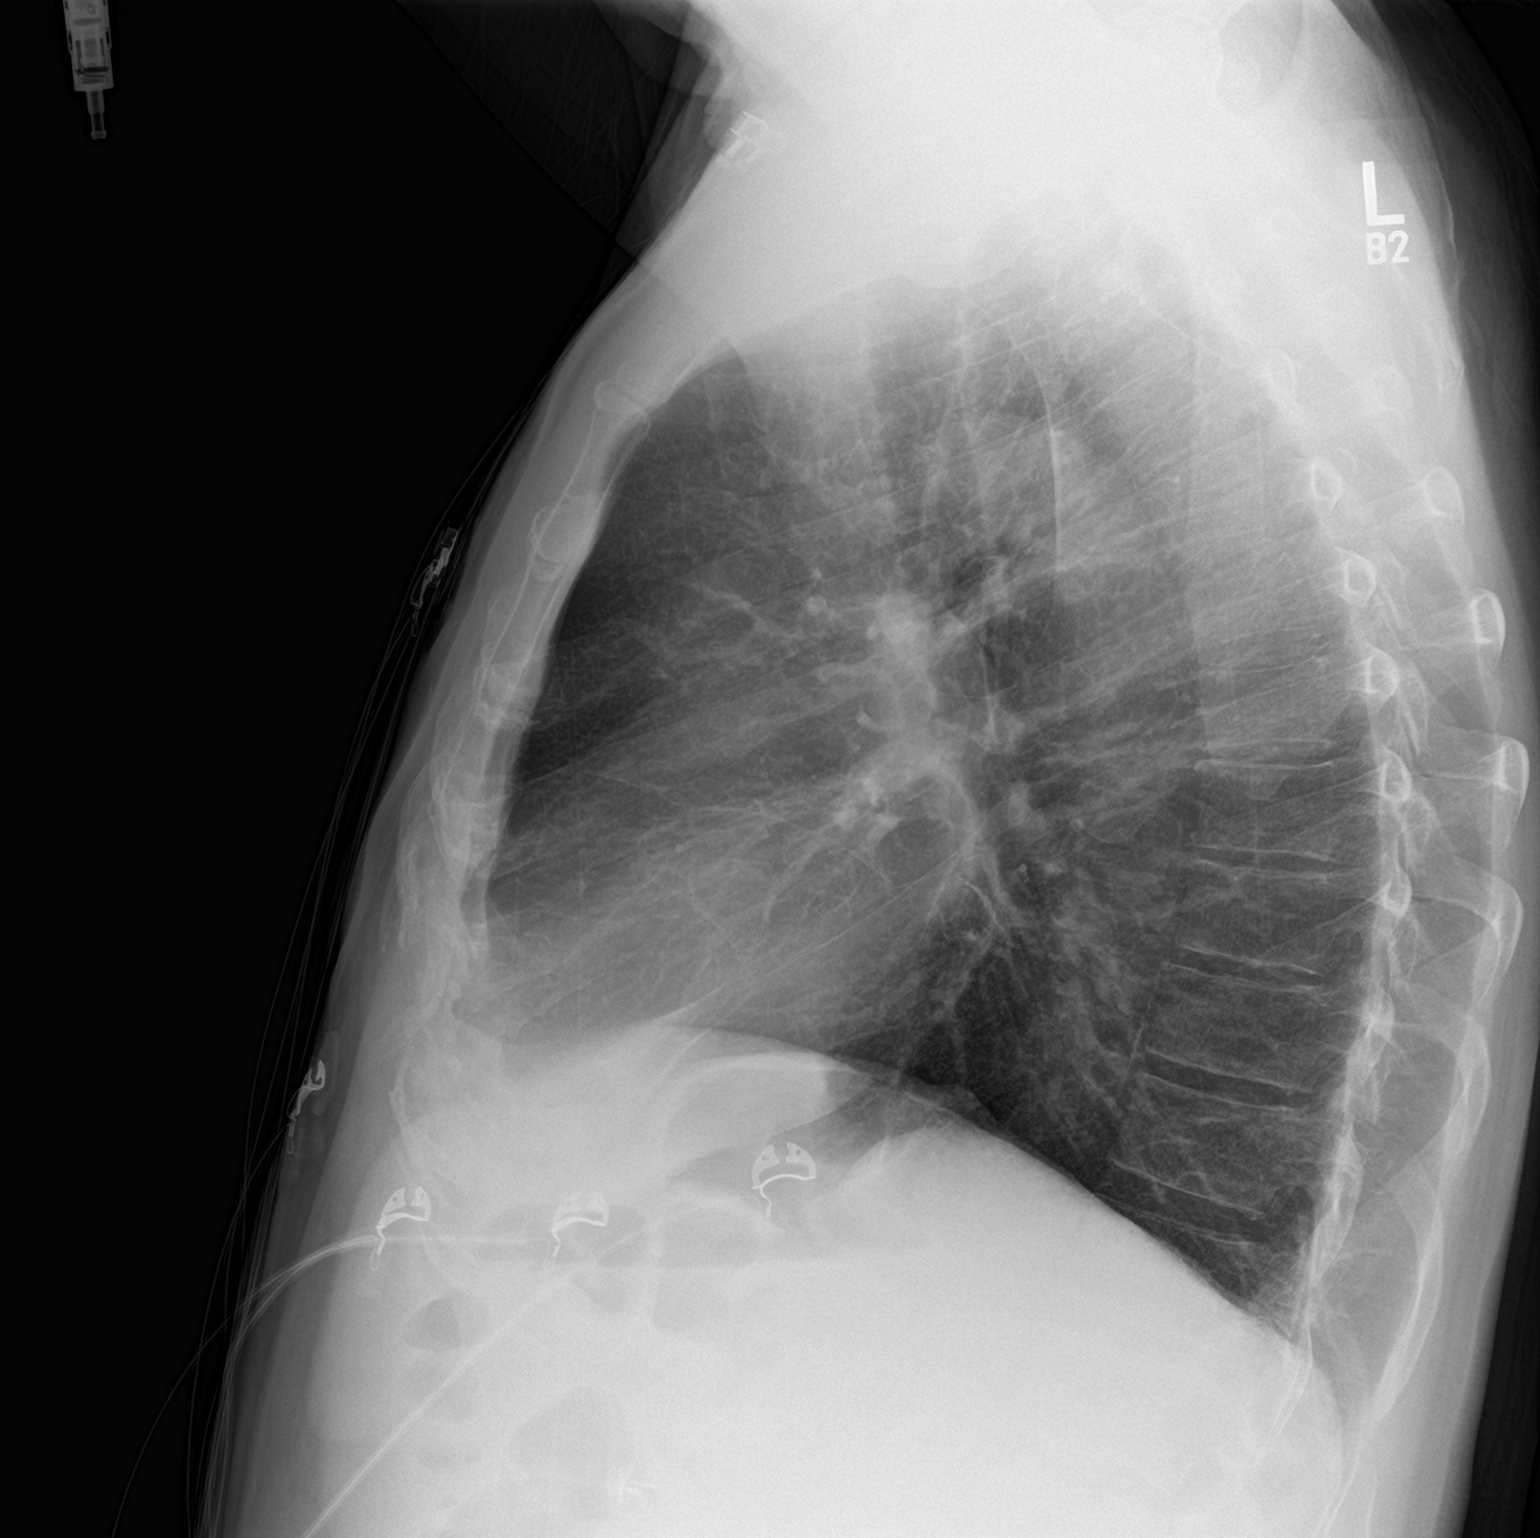

[2 of 2 positions shown; findings below may reference images not displayed]

FINDINGS: The lungs are well-expanded. The interstitial markings are coarse.
There is no alveolar infiltrate or pleural effusion. The heart and
pulmonary vascularity are normal. There is calcification in the wall
of the aortic arch. The observed bony thorax is unremarkable. The
gas pattern in the upper abdomen is normal.
IMPRESSION: No focal pneumonia nor CHF. Slight overall increase in the
prominence of the pulmonary interstitial markings may reflect
bronchitic changes either acute or chronic.

Thoracic aortic atherosclerosis.

## 2020-01-03 IMAGING — CT CT HEAD W/O CM
3 series · 16 of 47 positions shown, 19 images · non-contrast
Comparison: CT head 09/12/2011

CLINICAL DATA: Altered level of consciousness.

EXAM:
CT HEAD WITHOUT CONTRAST
TECHNIQUE: Contiguous axial images were obtained from the base of the skull
through the vertex without intravenous contrast.

[Series 2: head trauma wo · axial · 0.45mm/px · z∈[+1588,+1723]mm · 10 of 33 slices shown, 13 images]
[im 3/33  brain]
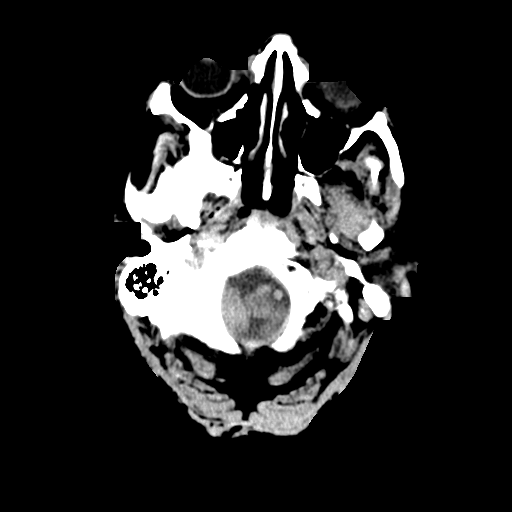
[im 3/33  bone]
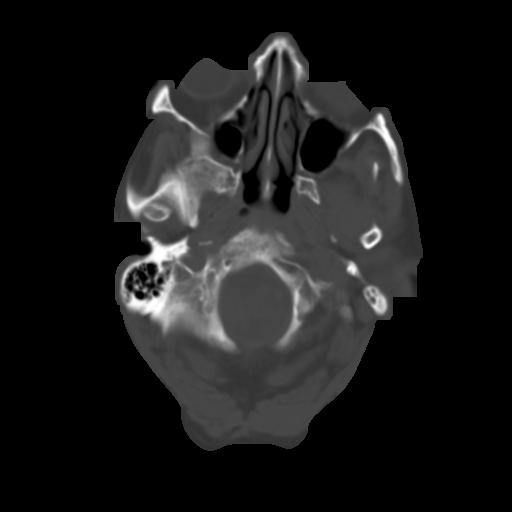
[im 6/33  brain]
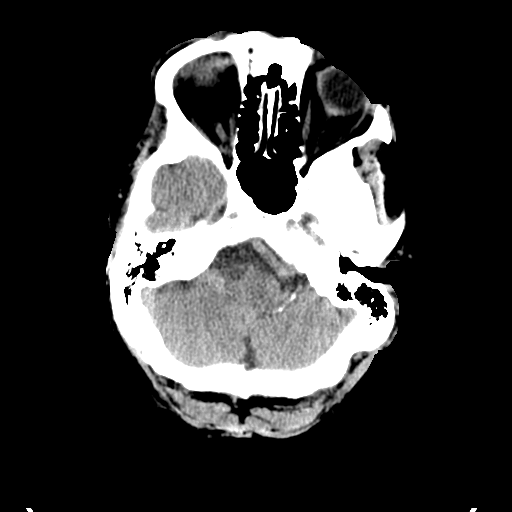
[im 9/33  brain]
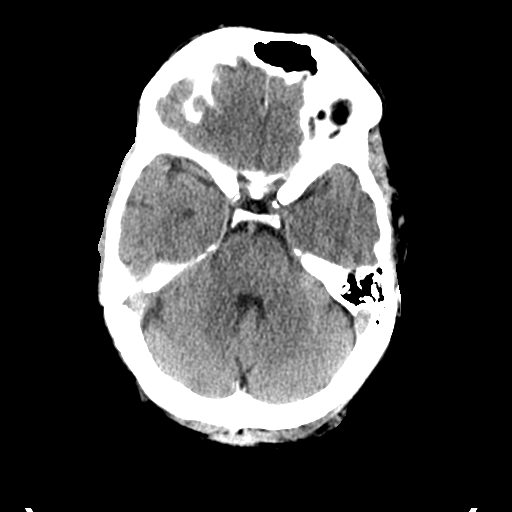
[im 12/33  brain]
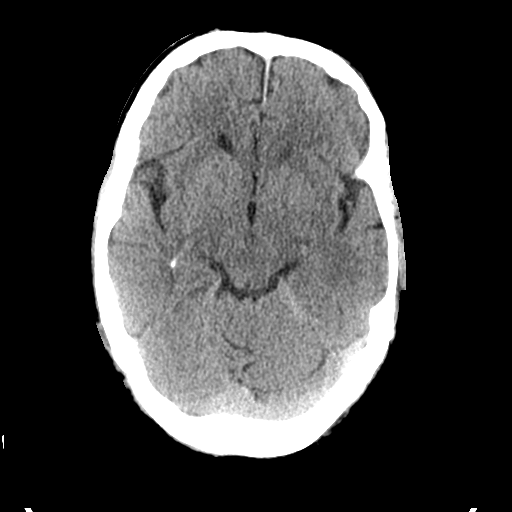
[im 15/33  brain]
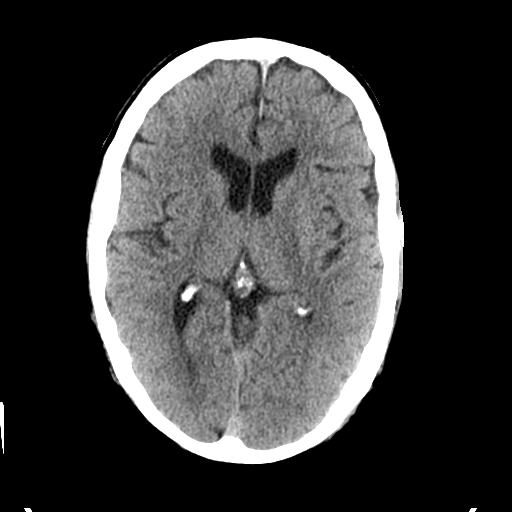
[im 15/33  bone]
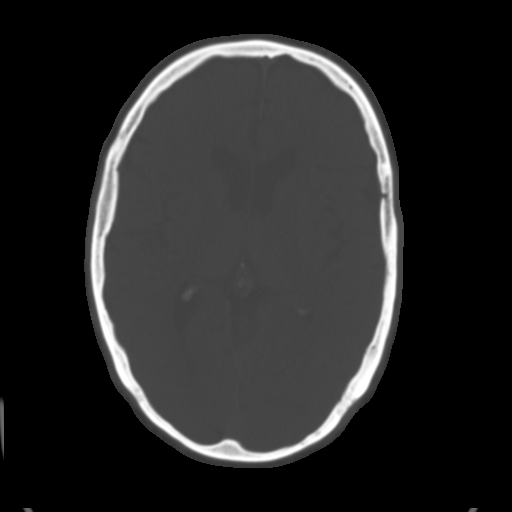
[im 18/33  brain]
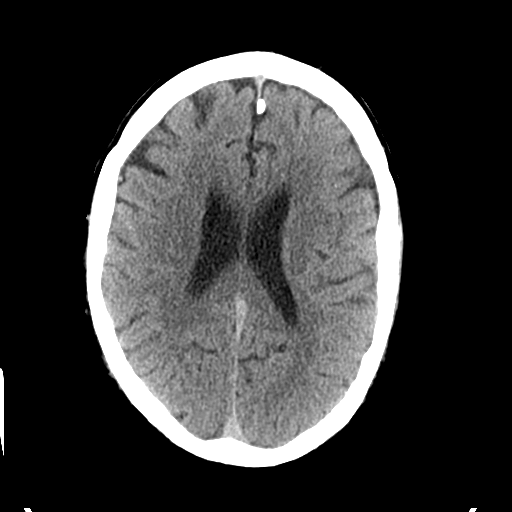
[im 21/33  brain]
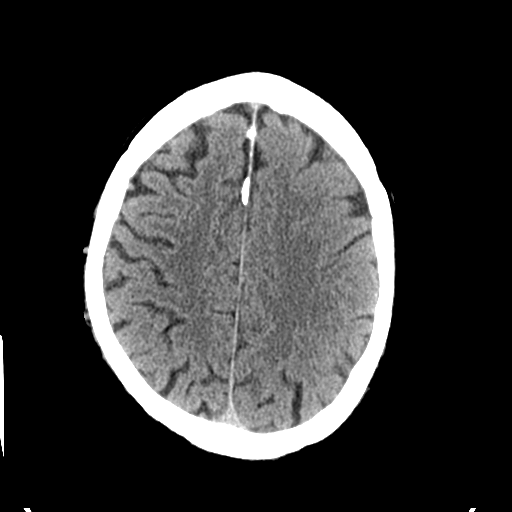
[im 25/33  brain]
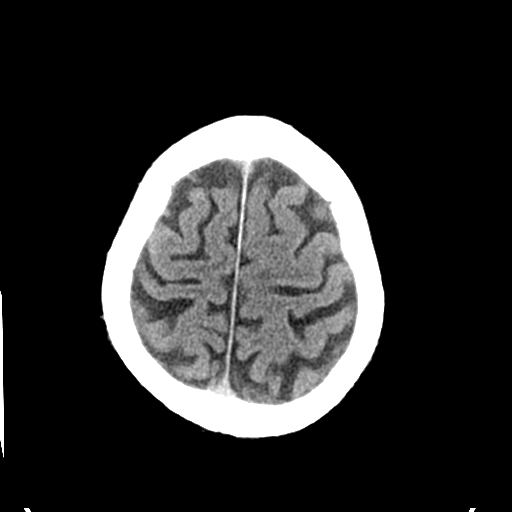
[im 27/33  brain]
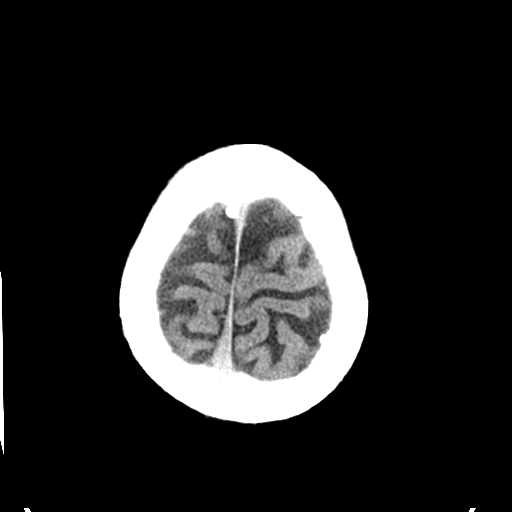
[im 27/33  bone]
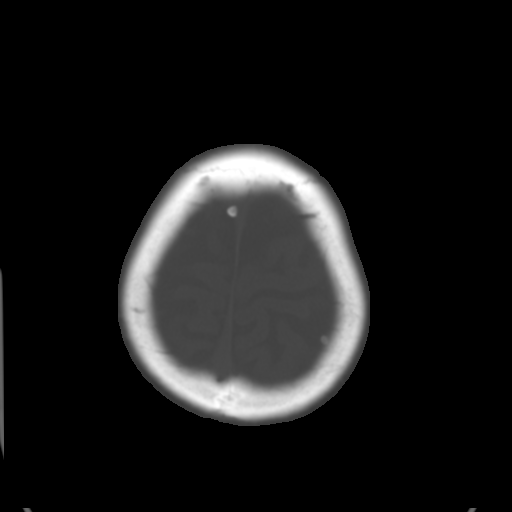
[im 30/33  brain]
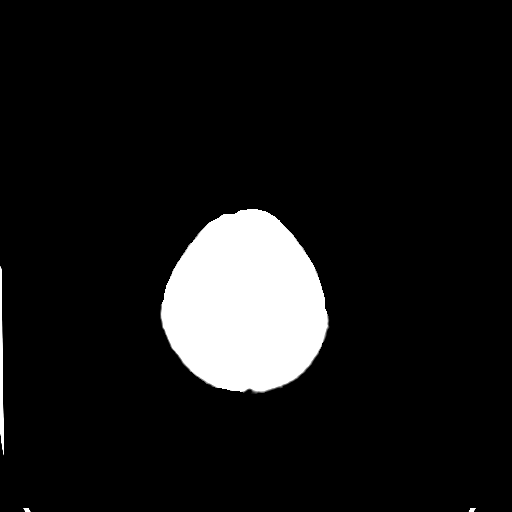

[Series 4: coronal soft tissue · coronal · 0.34mm/px · 3 of 81 slices shown]
[im 27/81  brain]
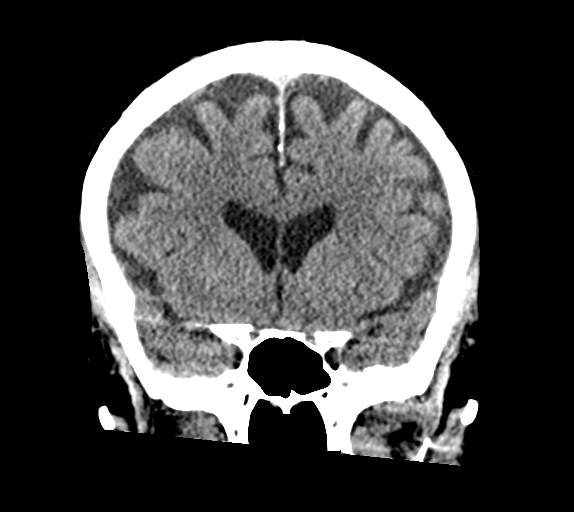
[im 36/81  brain]
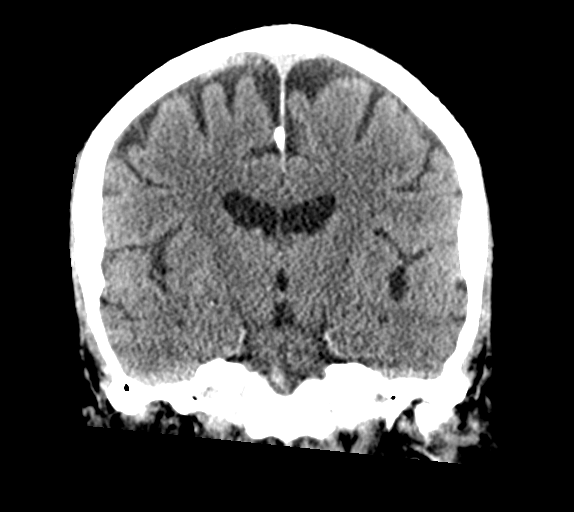
[im 45/81  brain]
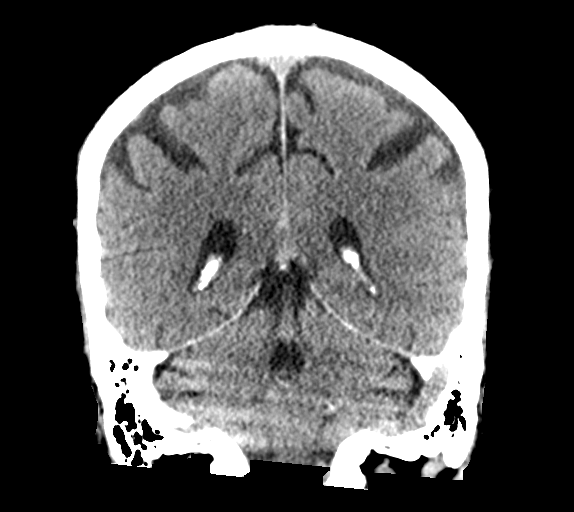

[Series 5: sagittal soft tissue · sagittal · 0.37mm/px · 3 of 57 slices shown]
[im 19/57  brain]
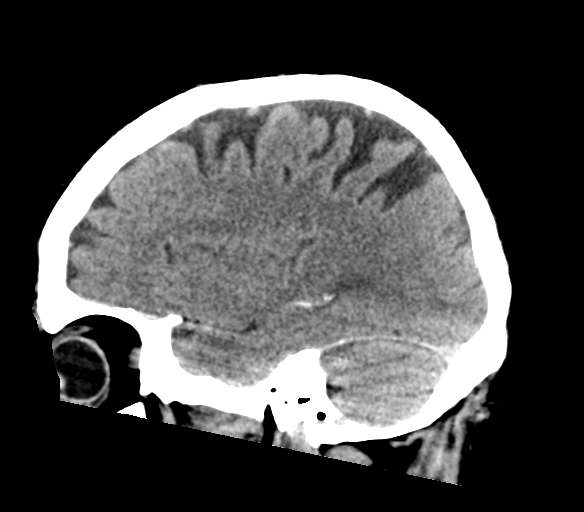
[im 29/57  brain]
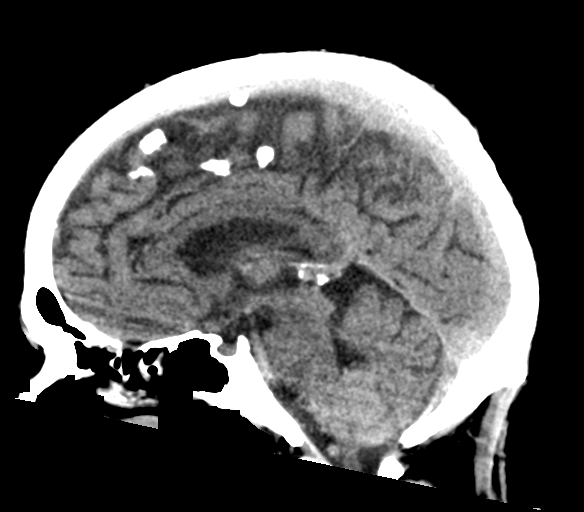
[im 38/57  brain]
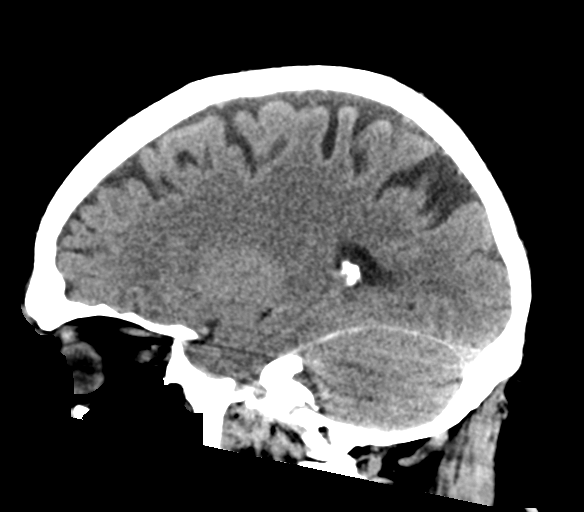

[16 of 47 positions shown; findings below may reference images not displayed]

FINDINGS: Brain: No evidence of acute infarction, hemorrhage, hydrocephalus,
extra-axial collection or mass lesion/mass effect.

Vascular: Negative for hyperdense vessel

Skull: Negative

Sinuses/Orbits: Negative

Other: None
IMPRESSION: Negative CT head

## 2020-01-15 DIAGNOSIS — H43813 Vitreous degeneration, bilateral: Secondary | ICD-10-CM | POA: Diagnosis not present

## 2020-01-21 DIAGNOSIS — Z0001 Encounter for general adult medical examination with abnormal findings: Secondary | ICD-10-CM | POA: Diagnosis not present

## 2020-01-21 DIAGNOSIS — Z136 Encounter for screening for cardiovascular disorders: Secondary | ICD-10-CM | POA: Diagnosis not present

## 2020-01-21 DIAGNOSIS — E119 Type 2 diabetes mellitus without complications: Secondary | ICD-10-CM | POA: Diagnosis not present

## 2020-01-21 DIAGNOSIS — S00462A Insect bite (nonvenomous) of left ear, initial encounter: Secondary | ICD-10-CM | POA: Diagnosis not present

## 2020-01-21 DIAGNOSIS — N281 Cyst of kidney, acquired: Secondary | ICD-10-CM | POA: Diagnosis not present

## 2020-01-21 DIAGNOSIS — S00469A Insect bite (nonvenomous) of unspecified ear, initial encounter: Secondary | ICD-10-CM | POA: Diagnosis not present

## 2020-01-21 DIAGNOSIS — F17220 Nicotine dependence, chewing tobacco, uncomplicated: Secondary | ICD-10-CM | POA: Diagnosis not present

## 2020-01-21 DIAGNOSIS — B351 Tinea unguium: Secondary | ICD-10-CM | POA: Diagnosis not present

## 2020-01-21 DIAGNOSIS — E7849 Other hyperlipidemia: Secondary | ICD-10-CM | POA: Diagnosis not present

## 2020-01-21 DIAGNOSIS — Z0000 Encounter for general adult medical examination without abnormal findings: Secondary | ICD-10-CM | POA: Diagnosis not present

## 2020-01-24 DIAGNOSIS — N402 Nodular prostate without lower urinary tract symptoms: Secondary | ICD-10-CM | POA: Diagnosis not present

## 2020-01-24 DIAGNOSIS — E782 Mixed hyperlipidemia: Secondary | ICD-10-CM | POA: Diagnosis not present

## 2020-01-24 DIAGNOSIS — Z0001 Encounter for general adult medical examination with abnormal findings: Secondary | ICD-10-CM | POA: Diagnosis not present

## 2020-01-24 DIAGNOSIS — E1165 Type 2 diabetes mellitus with hyperglycemia: Secondary | ICD-10-CM | POA: Diagnosis not present

## 2020-01-24 DIAGNOSIS — F17220 Nicotine dependence, chewing tobacco, uncomplicated: Secondary | ICD-10-CM | POA: Diagnosis not present

## 2020-01-24 DIAGNOSIS — M5442 Lumbago with sciatica, left side: Secondary | ICD-10-CM | POA: Diagnosis not present

## 2020-01-24 DIAGNOSIS — Z2821 Immunization not carried out because of patient refusal: Secondary | ICD-10-CM | POA: Diagnosis not present

## 2020-01-24 DIAGNOSIS — I10 Essential (primary) hypertension: Secondary | ICD-10-CM | POA: Diagnosis not present

## 2020-01-24 DIAGNOSIS — M25562 Pain in left knee: Secondary | ICD-10-CM | POA: Diagnosis not present

## 2020-02-01 DIAGNOSIS — Z0001 Encounter for general adult medical examination with abnormal findings: Secondary | ICD-10-CM | POA: Diagnosis not present

## 2020-02-01 DIAGNOSIS — E782 Mixed hyperlipidemia: Secondary | ICD-10-CM | POA: Diagnosis not present

## 2020-02-01 DIAGNOSIS — F17220 Nicotine dependence, chewing tobacco, uncomplicated: Secondary | ICD-10-CM | POA: Diagnosis not present

## 2020-02-01 DIAGNOSIS — Z2821 Immunization not carried out because of patient refusal: Secondary | ICD-10-CM | POA: Diagnosis not present

## 2020-02-01 DIAGNOSIS — M5442 Lumbago with sciatica, left side: Secondary | ICD-10-CM | POA: Diagnosis not present

## 2020-02-01 DIAGNOSIS — E1165 Type 2 diabetes mellitus with hyperglycemia: Secondary | ICD-10-CM | POA: Diagnosis not present

## 2020-02-01 DIAGNOSIS — M25562 Pain in left knee: Secondary | ICD-10-CM | POA: Diagnosis not present

## 2020-02-01 DIAGNOSIS — N402 Nodular prostate without lower urinary tract symptoms: Secondary | ICD-10-CM | POA: Diagnosis not present

## 2020-02-01 DIAGNOSIS — I10 Essential (primary) hypertension: Secondary | ICD-10-CM | POA: Diagnosis not present

## 2020-03-05 DIAGNOSIS — I10 Essential (primary) hypertension: Secondary | ICD-10-CM | POA: Diagnosis not present

## 2020-03-05 DIAGNOSIS — Z0001 Encounter for general adult medical examination with abnormal findings: Secondary | ICD-10-CM | POA: Diagnosis not present

## 2020-03-05 DIAGNOSIS — M25562 Pain in left knee: Secondary | ICD-10-CM | POA: Diagnosis not present

## 2020-03-05 DIAGNOSIS — E1165 Type 2 diabetes mellitus with hyperglycemia: Secondary | ICD-10-CM | POA: Diagnosis not present

## 2020-03-05 DIAGNOSIS — Z2821 Immunization not carried out because of patient refusal: Secondary | ICD-10-CM | POA: Diagnosis not present

## 2020-03-05 DIAGNOSIS — N402 Nodular prostate without lower urinary tract symptoms: Secondary | ICD-10-CM | POA: Diagnosis not present

## 2020-03-05 DIAGNOSIS — F17220 Nicotine dependence, chewing tobacco, uncomplicated: Secondary | ICD-10-CM | POA: Diagnosis not present

## 2020-03-05 DIAGNOSIS — E782 Mixed hyperlipidemia: Secondary | ICD-10-CM | POA: Diagnosis not present

## 2020-03-05 DIAGNOSIS — M5442 Lumbago with sciatica, left side: Secondary | ICD-10-CM | POA: Diagnosis not present

## 2020-04-25 DIAGNOSIS — M25562 Pain in left knee: Secondary | ICD-10-CM | POA: Diagnosis not present

## 2020-04-25 DIAGNOSIS — N402 Nodular prostate without lower urinary tract symptoms: Secondary | ICD-10-CM | POA: Diagnosis not present

## 2020-04-25 DIAGNOSIS — E1165 Type 2 diabetes mellitus with hyperglycemia: Secondary | ICD-10-CM | POA: Diagnosis not present

## 2020-04-25 DIAGNOSIS — E782 Mixed hyperlipidemia: Secondary | ICD-10-CM | POA: Diagnosis not present

## 2020-04-25 DIAGNOSIS — M5442 Lumbago with sciatica, left side: Secondary | ICD-10-CM | POA: Diagnosis not present

## 2020-04-25 DIAGNOSIS — Z2821 Immunization not carried out because of patient refusal: Secondary | ICD-10-CM | POA: Diagnosis not present

## 2020-04-25 DIAGNOSIS — F17220 Nicotine dependence, chewing tobacco, uncomplicated: Secondary | ICD-10-CM | POA: Diagnosis not present

## 2020-04-25 DIAGNOSIS — I10 Essential (primary) hypertension: Secondary | ICD-10-CM | POA: Diagnosis not present

## 2020-04-25 DIAGNOSIS — Z0001 Encounter for general adult medical examination with abnormal findings: Secondary | ICD-10-CM | POA: Diagnosis not present

## 2020-05-14 DIAGNOSIS — S00469A Insect bite (nonvenomous) of unspecified ear, initial encounter: Secondary | ICD-10-CM | POA: Diagnosis not present

## 2020-05-14 DIAGNOSIS — Z0001 Encounter for general adult medical examination with abnormal findings: Secondary | ICD-10-CM | POA: Diagnosis not present

## 2020-05-14 DIAGNOSIS — Z0000 Encounter for general adult medical examination without abnormal findings: Secondary | ICD-10-CM | POA: Diagnosis not present

## 2020-05-14 DIAGNOSIS — N281 Cyst of kidney, acquired: Secondary | ICD-10-CM | POA: Diagnosis not present

## 2020-05-14 DIAGNOSIS — E7849 Other hyperlipidemia: Secondary | ICD-10-CM | POA: Diagnosis not present

## 2020-05-14 DIAGNOSIS — Z136 Encounter for screening for cardiovascular disorders: Secondary | ICD-10-CM | POA: Diagnosis not present

## 2020-05-14 DIAGNOSIS — N402 Nodular prostate without lower urinary tract symptoms: Secondary | ICD-10-CM | POA: Diagnosis not present

## 2020-05-14 DIAGNOSIS — S00462A Insect bite (nonvenomous) of left ear, initial encounter: Secondary | ICD-10-CM | POA: Diagnosis not present

## 2020-05-14 DIAGNOSIS — F17220 Nicotine dependence, chewing tobacco, uncomplicated: Secondary | ICD-10-CM | POA: Diagnosis not present

## 2020-05-14 DIAGNOSIS — B351 Tinea unguium: Secondary | ICD-10-CM | POA: Diagnosis not present

## 2020-05-20 DIAGNOSIS — E782 Mixed hyperlipidemia: Secondary | ICD-10-CM | POA: Diagnosis not present

## 2020-05-20 DIAGNOSIS — I10 Essential (primary) hypertension: Secondary | ICD-10-CM | POA: Diagnosis not present

## 2020-05-20 DIAGNOSIS — N402 Nodular prostate without lower urinary tract symptoms: Secondary | ICD-10-CM | POA: Diagnosis not present

## 2020-05-20 DIAGNOSIS — E1165 Type 2 diabetes mellitus with hyperglycemia: Secondary | ICD-10-CM | POA: Diagnosis not present

## 2020-05-20 DIAGNOSIS — M25562 Pain in left knee: Secondary | ICD-10-CM | POA: Diagnosis not present

## 2020-05-20 DIAGNOSIS — M5442 Lumbago with sciatica, left side: Secondary | ICD-10-CM | POA: Diagnosis not present

## 2020-05-20 DIAGNOSIS — Z0001 Encounter for general adult medical examination with abnormal findings: Secondary | ICD-10-CM | POA: Diagnosis not present

## 2020-05-20 DIAGNOSIS — Z2821 Immunization not carried out because of patient refusal: Secondary | ICD-10-CM | POA: Diagnosis not present

## 2020-05-20 DIAGNOSIS — F17220 Nicotine dependence, chewing tobacco, uncomplicated: Secondary | ICD-10-CM | POA: Diagnosis not present

## 2020-05-24 DIAGNOSIS — M25562 Pain in left knee: Secondary | ICD-10-CM | POA: Diagnosis not present

## 2020-05-24 DIAGNOSIS — F17220 Nicotine dependence, chewing tobacco, uncomplicated: Secondary | ICD-10-CM | POA: Diagnosis not present

## 2020-05-24 DIAGNOSIS — E1165 Type 2 diabetes mellitus with hyperglycemia: Secondary | ICD-10-CM | POA: Diagnosis not present

## 2020-05-24 DIAGNOSIS — M5442 Lumbago with sciatica, left side: Secondary | ICD-10-CM | POA: Diagnosis not present

## 2020-05-24 DIAGNOSIS — I10 Essential (primary) hypertension: Secondary | ICD-10-CM | POA: Diagnosis not present

## 2020-05-24 DIAGNOSIS — E782 Mixed hyperlipidemia: Secondary | ICD-10-CM | POA: Diagnosis not present

## 2020-05-24 DIAGNOSIS — N402 Nodular prostate without lower urinary tract symptoms: Secondary | ICD-10-CM | POA: Diagnosis not present

## 2020-05-24 DIAGNOSIS — Z0001 Encounter for general adult medical examination with abnormal findings: Secondary | ICD-10-CM | POA: Diagnosis not present

## 2020-06-23 DIAGNOSIS — E7849 Other hyperlipidemia: Secondary | ICD-10-CM | POA: Diagnosis not present

## 2020-06-23 DIAGNOSIS — I10 Essential (primary) hypertension: Secondary | ICD-10-CM | POA: Diagnosis not present

## 2020-06-23 DIAGNOSIS — E1165 Type 2 diabetes mellitus with hyperglycemia: Secondary | ICD-10-CM | POA: Diagnosis not present

## 2020-08-14 DIAGNOSIS — E1169 Type 2 diabetes mellitus with other specified complication: Secondary | ICD-10-CM | POA: Diagnosis not present

## 2020-08-14 DIAGNOSIS — I10 Essential (primary) hypertension: Secondary | ICD-10-CM | POA: Diagnosis not present

## 2020-08-14 DIAGNOSIS — E1165 Type 2 diabetes mellitus with hyperglycemia: Secondary | ICD-10-CM | POA: Diagnosis not present

## 2020-08-14 DIAGNOSIS — E782 Mixed hyperlipidemia: Secondary | ICD-10-CM | POA: Diagnosis not present

## 2020-08-14 DIAGNOSIS — N401 Enlarged prostate with lower urinary tract symptoms: Secondary | ICD-10-CM | POA: Diagnosis not present

## 2020-08-14 DIAGNOSIS — E559 Vitamin D deficiency, unspecified: Secondary | ICD-10-CM | POA: Diagnosis not present

## 2020-08-18 DIAGNOSIS — Z2821 Immunization not carried out because of patient refusal: Secondary | ICD-10-CM | POA: Diagnosis not present

## 2020-08-18 DIAGNOSIS — E782 Mixed hyperlipidemia: Secondary | ICD-10-CM | POA: Diagnosis not present

## 2020-08-18 DIAGNOSIS — M25562 Pain in left knee: Secondary | ICD-10-CM | POA: Diagnosis not present

## 2020-08-18 DIAGNOSIS — F17220 Nicotine dependence, chewing tobacco, uncomplicated: Secondary | ICD-10-CM | POA: Diagnosis not present

## 2020-08-18 DIAGNOSIS — E1165 Type 2 diabetes mellitus with hyperglycemia: Secondary | ICD-10-CM | POA: Diagnosis not present

## 2020-08-18 DIAGNOSIS — I10 Essential (primary) hypertension: Secondary | ICD-10-CM | POA: Diagnosis not present

## 2020-08-18 DIAGNOSIS — E559 Vitamin D deficiency, unspecified: Secondary | ICD-10-CM | POA: Diagnosis not present

## 2020-08-18 DIAGNOSIS — N402 Nodular prostate without lower urinary tract symptoms: Secondary | ICD-10-CM | POA: Diagnosis not present

## 2020-08-18 DIAGNOSIS — M5442 Lumbago with sciatica, left side: Secondary | ICD-10-CM | POA: Diagnosis not present

## 2020-08-24 DIAGNOSIS — E1165 Type 2 diabetes mellitus with hyperglycemia: Secondary | ICD-10-CM | POA: Diagnosis not present

## 2020-08-24 DIAGNOSIS — K219 Gastro-esophageal reflux disease without esophagitis: Secondary | ICD-10-CM | POA: Diagnosis not present

## 2020-10-15 DIAGNOSIS — E1165 Type 2 diabetes mellitus with hyperglycemia: Secondary | ICD-10-CM | POA: Diagnosis not present

## 2020-10-15 DIAGNOSIS — K219 Gastro-esophageal reflux disease without esophagitis: Secondary | ICD-10-CM | POA: Diagnosis not present

## 2020-11-13 DIAGNOSIS — R972 Elevated prostate specific antigen [PSA]: Secondary | ICD-10-CM | POA: Diagnosis not present

## 2020-11-13 DIAGNOSIS — E785 Hyperlipidemia, unspecified: Secondary | ICD-10-CM | POA: Diagnosis not present

## 2020-11-13 DIAGNOSIS — E119 Type 2 diabetes mellitus without complications: Secondary | ICD-10-CM | POA: Diagnosis not present

## 2020-11-17 DIAGNOSIS — R809 Proteinuria, unspecified: Secondary | ICD-10-CM | POA: Diagnosis not present

## 2020-11-17 DIAGNOSIS — E875 Hyperkalemia: Secondary | ICD-10-CM | POA: Diagnosis not present

## 2020-11-17 DIAGNOSIS — E1165 Type 2 diabetes mellitus with hyperglycemia: Secondary | ICD-10-CM | POA: Diagnosis not present

## 2020-11-17 DIAGNOSIS — M5442 Lumbago with sciatica, left side: Secondary | ICD-10-CM | POA: Diagnosis not present

## 2020-11-17 DIAGNOSIS — M25562 Pain in left knee: Secondary | ICD-10-CM | POA: Diagnosis not present

## 2020-11-17 DIAGNOSIS — E782 Mixed hyperlipidemia: Secondary | ICD-10-CM | POA: Diagnosis not present

## 2020-11-17 DIAGNOSIS — I10 Essential (primary) hypertension: Secondary | ICD-10-CM | POA: Diagnosis not present

## 2020-11-17 DIAGNOSIS — N400 Enlarged prostate without lower urinary tract symptoms: Secondary | ICD-10-CM | POA: Diagnosis not present

## 2021-02-18 DIAGNOSIS — I10 Essential (primary) hypertension: Secondary | ICD-10-CM | POA: Diagnosis not present

## 2021-02-18 DIAGNOSIS — N400 Enlarged prostate without lower urinary tract symptoms: Secondary | ICD-10-CM | POA: Diagnosis not present

## 2021-02-18 DIAGNOSIS — E119 Type 2 diabetes mellitus without complications: Secondary | ICD-10-CM | POA: Diagnosis not present

## 2021-02-23 DIAGNOSIS — E782 Mixed hyperlipidemia: Secondary | ICD-10-CM | POA: Diagnosis not present

## 2021-02-23 DIAGNOSIS — M25562 Pain in left knee: Secondary | ICD-10-CM | POA: Diagnosis not present

## 2021-02-23 DIAGNOSIS — I10 Essential (primary) hypertension: Secondary | ICD-10-CM | POA: Diagnosis not present

## 2021-02-23 DIAGNOSIS — K219 Gastro-esophageal reflux disease without esophagitis: Secondary | ICD-10-CM | POA: Diagnosis not present

## 2021-02-23 DIAGNOSIS — M5442 Lumbago with sciatica, left side: Secondary | ICD-10-CM | POA: Diagnosis not present

## 2021-02-23 DIAGNOSIS — Z0001 Encounter for general adult medical examination with abnormal findings: Secondary | ICD-10-CM | POA: Diagnosis not present

## 2021-02-23 DIAGNOSIS — R809 Proteinuria, unspecified: Secondary | ICD-10-CM | POA: Diagnosis not present

## 2021-02-23 DIAGNOSIS — E1165 Type 2 diabetes mellitus with hyperglycemia: Secondary | ICD-10-CM | POA: Diagnosis not present

## 2021-02-23 DIAGNOSIS — N400 Enlarged prostate without lower urinary tract symptoms: Secondary | ICD-10-CM | POA: Diagnosis not present

## 2021-02-23 DIAGNOSIS — Z23 Encounter for immunization: Secondary | ICD-10-CM | POA: Diagnosis not present

## 2021-03-30 DIAGNOSIS — J09X2 Influenza due to identified novel influenza A virus with other respiratory manifestations: Secondary | ICD-10-CM | POA: Diagnosis not present

## 2021-03-30 DIAGNOSIS — R059 Cough, unspecified: Secondary | ICD-10-CM | POA: Diagnosis not present

## 2021-07-01 DIAGNOSIS — E782 Mixed hyperlipidemia: Secondary | ICD-10-CM | POA: Diagnosis not present

## 2021-07-01 DIAGNOSIS — E1165 Type 2 diabetes mellitus with hyperglycemia: Secondary | ICD-10-CM | POA: Diagnosis not present

## 2021-07-06 DIAGNOSIS — E875 Hyperkalemia: Secondary | ICD-10-CM | POA: Diagnosis not present

## 2021-07-06 DIAGNOSIS — E1165 Type 2 diabetes mellitus with hyperglycemia: Secondary | ICD-10-CM | POA: Diagnosis not present

## 2021-07-06 DIAGNOSIS — K117 Disturbances of salivary secretion: Secondary | ICD-10-CM | POA: Diagnosis not present

## 2021-07-06 DIAGNOSIS — E782 Mixed hyperlipidemia: Secondary | ICD-10-CM | POA: Diagnosis not present

## 2021-07-06 DIAGNOSIS — R809 Proteinuria, unspecified: Secondary | ICD-10-CM | POA: Diagnosis not present

## 2021-07-06 DIAGNOSIS — I10 Essential (primary) hypertension: Secondary | ICD-10-CM | POA: Diagnosis not present

## 2021-07-06 DIAGNOSIS — M25562 Pain in left knee: Secondary | ICD-10-CM | POA: Diagnosis not present

## 2021-07-06 DIAGNOSIS — M5442 Lumbago with sciatica, left side: Secondary | ICD-10-CM | POA: Diagnosis not present

## 2021-07-06 DIAGNOSIS — E559 Vitamin D deficiency, unspecified: Secondary | ICD-10-CM | POA: Diagnosis not present

## 2021-09-23 DIAGNOSIS — E1165 Type 2 diabetes mellitus with hyperglycemia: Secondary | ICD-10-CM | POA: Diagnosis not present

## 2021-09-23 DIAGNOSIS — I10 Essential (primary) hypertension: Secondary | ICD-10-CM | POA: Diagnosis not present

## 2021-09-23 DIAGNOSIS — E782 Mixed hyperlipidemia: Secondary | ICD-10-CM | POA: Diagnosis not present

## 2021-10-14 DIAGNOSIS — E782 Mixed hyperlipidemia: Secondary | ICD-10-CM | POA: Diagnosis not present

## 2021-10-14 DIAGNOSIS — E1165 Type 2 diabetes mellitus with hyperglycemia: Secondary | ICD-10-CM | POA: Diagnosis not present

## 2021-10-21 DIAGNOSIS — M5442 Lumbago with sciatica, left side: Secondary | ICD-10-CM | POA: Diagnosis not present

## 2021-10-21 DIAGNOSIS — R809 Proteinuria, unspecified: Secondary | ICD-10-CM | POA: Diagnosis not present

## 2021-10-21 DIAGNOSIS — M25562 Pain in left knee: Secondary | ICD-10-CM | POA: Diagnosis not present

## 2021-10-21 DIAGNOSIS — I10 Essential (primary) hypertension: Secondary | ICD-10-CM | POA: Diagnosis not present

## 2021-10-21 DIAGNOSIS — E782 Mixed hyperlipidemia: Secondary | ICD-10-CM | POA: Diagnosis not present

## 2021-10-21 DIAGNOSIS — N400 Enlarged prostate without lower urinary tract symptoms: Secondary | ICD-10-CM | POA: Diagnosis not present

## 2021-10-21 DIAGNOSIS — E1165 Type 2 diabetes mellitus with hyperglycemia: Secondary | ICD-10-CM | POA: Diagnosis not present

## 2021-10-21 DIAGNOSIS — Z0001 Encounter for general adult medical examination with abnormal findings: Secondary | ICD-10-CM | POA: Diagnosis not present

## 2021-11-18 DIAGNOSIS — E119 Type 2 diabetes mellitus without complications: Secondary | ICD-10-CM | POA: Diagnosis not present

## 2022-02-01 DIAGNOSIS — E1165 Type 2 diabetes mellitus with hyperglycemia: Secondary | ICD-10-CM | POA: Diagnosis not present

## 2022-02-01 DIAGNOSIS — Z23 Encounter for immunization: Secondary | ICD-10-CM | POA: Diagnosis not present

## 2022-02-01 DIAGNOSIS — E782 Mixed hyperlipidemia: Secondary | ICD-10-CM | POA: Diagnosis not present

## 2022-02-03 DIAGNOSIS — M25562 Pain in left knee: Secondary | ICD-10-CM | POA: Diagnosis not present

## 2022-02-03 DIAGNOSIS — M5442 Lumbago with sciatica, left side: Secondary | ICD-10-CM | POA: Diagnosis not present

## 2022-02-03 DIAGNOSIS — E118 Type 2 diabetes mellitus with unspecified complications: Secondary | ICD-10-CM | POA: Diagnosis not present

## 2022-02-03 DIAGNOSIS — N400 Enlarged prostate without lower urinary tract symptoms: Secondary | ICD-10-CM | POA: Diagnosis not present

## 2022-02-03 DIAGNOSIS — I10 Essential (primary) hypertension: Secondary | ICD-10-CM | POA: Diagnosis not present

## 2022-02-03 DIAGNOSIS — E1165 Type 2 diabetes mellitus with hyperglycemia: Secondary | ICD-10-CM | POA: Diagnosis not present

## 2022-02-03 DIAGNOSIS — E782 Mixed hyperlipidemia: Secondary | ICD-10-CM | POA: Diagnosis not present

## 2022-02-03 DIAGNOSIS — R809 Proteinuria, unspecified: Secondary | ICD-10-CM | POA: Diagnosis not present

## 2022-05-13 DIAGNOSIS — E118 Type 2 diabetes mellitus with unspecified complications: Secondary | ICD-10-CM | POA: Diagnosis not present

## 2022-05-13 DIAGNOSIS — E782 Mixed hyperlipidemia: Secondary | ICD-10-CM | POA: Diagnosis not present

## 2022-05-18 DIAGNOSIS — E1169 Type 2 diabetes mellitus with other specified complication: Secondary | ICD-10-CM | POA: Diagnosis not present

## 2022-05-18 DIAGNOSIS — M5440 Lumbago with sciatica, unspecified side: Secondary | ICD-10-CM | POA: Diagnosis not present

## 2022-05-18 DIAGNOSIS — I10 Essential (primary) hypertension: Secondary | ICD-10-CM | POA: Diagnosis not present

## 2022-05-18 DIAGNOSIS — E875 Hyperkalemia: Secondary | ICD-10-CM | POA: Diagnosis not present

## 2022-05-18 DIAGNOSIS — R809 Proteinuria, unspecified: Secondary | ICD-10-CM | POA: Diagnosis not present

## 2022-05-18 DIAGNOSIS — E118 Type 2 diabetes mellitus with unspecified complications: Secondary | ICD-10-CM | POA: Diagnosis not present

## 2022-05-18 DIAGNOSIS — E782 Mixed hyperlipidemia: Secondary | ICD-10-CM | POA: Diagnosis not present

## 2022-05-18 DIAGNOSIS — E559 Vitamin D deficiency, unspecified: Secondary | ICD-10-CM | POA: Diagnosis not present

## 2022-08-25 DIAGNOSIS — E118 Type 2 diabetes mellitus with unspecified complications: Secondary | ICD-10-CM | POA: Diagnosis not present

## 2022-08-25 DIAGNOSIS — E782 Mixed hyperlipidemia: Secondary | ICD-10-CM | POA: Diagnosis not present

## 2022-08-25 DIAGNOSIS — E559 Vitamin D deficiency, unspecified: Secondary | ICD-10-CM | POA: Diagnosis not present

## 2022-08-25 DIAGNOSIS — Z125 Encounter for screening for malignant neoplasm of prostate: Secondary | ICD-10-CM | POA: Diagnosis not present

## 2022-08-30 DIAGNOSIS — E119 Type 2 diabetes mellitus without complications: Secondary | ICD-10-CM | POA: Diagnosis not present

## 2022-08-31 DIAGNOSIS — E118 Type 2 diabetes mellitus with unspecified complications: Secondary | ICD-10-CM | POA: Diagnosis not present

## 2022-08-31 DIAGNOSIS — R809 Proteinuria, unspecified: Secondary | ICD-10-CM | POA: Diagnosis not present

## 2022-08-31 DIAGNOSIS — K1230 Oral mucositis (ulcerative), unspecified: Secondary | ICD-10-CM | POA: Diagnosis not present

## 2022-08-31 DIAGNOSIS — M25562 Pain in left knee: Secondary | ICD-10-CM | POA: Diagnosis not present

## 2022-08-31 DIAGNOSIS — I10 Essential (primary) hypertension: Secondary | ICD-10-CM | POA: Diagnosis not present

## 2022-08-31 DIAGNOSIS — E782 Mixed hyperlipidemia: Secondary | ICD-10-CM | POA: Diagnosis not present

## 2022-08-31 DIAGNOSIS — N400 Enlarged prostate without lower urinary tract symptoms: Secondary | ICD-10-CM | POA: Diagnosis not present

## 2022-08-31 DIAGNOSIS — M5442 Lumbago with sciatica, left side: Secondary | ICD-10-CM | POA: Diagnosis not present

## 2022-12-31 DIAGNOSIS — E782 Mixed hyperlipidemia: Secondary | ICD-10-CM | POA: Diagnosis not present

## 2022-12-31 DIAGNOSIS — E559 Vitamin D deficiency, unspecified: Secondary | ICD-10-CM | POA: Diagnosis not present

## 2022-12-31 DIAGNOSIS — E118 Type 2 diabetes mellitus with unspecified complications: Secondary | ICD-10-CM | POA: Diagnosis not present

## 2023-01-03 DIAGNOSIS — N400 Enlarged prostate without lower urinary tract symptoms: Secondary | ICD-10-CM | POA: Diagnosis not present

## 2023-01-03 DIAGNOSIS — I700 Atherosclerosis of aorta: Secondary | ICD-10-CM | POA: Diagnosis not present

## 2023-01-03 DIAGNOSIS — I10 Essential (primary) hypertension: Secondary | ICD-10-CM | POA: Diagnosis not present

## 2023-01-03 DIAGNOSIS — D649 Anemia, unspecified: Secondary | ICD-10-CM | POA: Diagnosis not present

## 2023-01-03 DIAGNOSIS — R809 Proteinuria, unspecified: Secondary | ICD-10-CM | POA: Diagnosis not present

## 2023-01-03 DIAGNOSIS — M25562 Pain in left knee: Secondary | ICD-10-CM | POA: Diagnosis not present

## 2023-01-03 DIAGNOSIS — E782 Mixed hyperlipidemia: Secondary | ICD-10-CM | POA: Diagnosis not present

## 2023-01-03 DIAGNOSIS — M5442 Lumbago with sciatica, left side: Secondary | ICD-10-CM | POA: Diagnosis not present

## 2023-01-03 DIAGNOSIS — E118 Type 2 diabetes mellitus with unspecified complications: Secondary | ICD-10-CM | POA: Diagnosis not present

## 2023-05-10 DIAGNOSIS — S91001D Unspecified open wound, right ankle, subsequent encounter: Secondary | ICD-10-CM | POA: Diagnosis not present

## 2023-05-10 DIAGNOSIS — Z23 Encounter for immunization: Secondary | ICD-10-CM | POA: Diagnosis not present

## 2023-05-10 DIAGNOSIS — W19XXXD Unspecified fall, subsequent encounter: Secondary | ICD-10-CM | POA: Diagnosis not present

## 2023-05-10 DIAGNOSIS — S91001A Unspecified open wound, right ankle, initial encounter: Secondary | ICD-10-CM | POA: Diagnosis not present

## 2023-05-10 DIAGNOSIS — G4700 Insomnia, unspecified: Secondary | ICD-10-CM | POA: Diagnosis not present

## 2023-05-17 DIAGNOSIS — E782 Mixed hyperlipidemia: Secondary | ICD-10-CM | POA: Diagnosis not present

## 2023-05-17 DIAGNOSIS — E118 Type 2 diabetes mellitus with unspecified complications: Secondary | ICD-10-CM | POA: Diagnosis not present

## 2023-05-17 DIAGNOSIS — D649 Anemia, unspecified: Secondary | ICD-10-CM | POA: Diagnosis not present

## 2023-05-17 DIAGNOSIS — E559 Vitamin D deficiency, unspecified: Secondary | ICD-10-CM | POA: Diagnosis not present

## 2023-05-17 DIAGNOSIS — D510 Vitamin B12 deficiency anemia due to intrinsic factor deficiency: Secondary | ICD-10-CM | POA: Diagnosis not present

## 2023-05-24 DIAGNOSIS — E1165 Type 2 diabetes mellitus with hyperglycemia: Secondary | ICD-10-CM | POA: Diagnosis not present

## 2023-05-24 DIAGNOSIS — E782 Mixed hyperlipidemia: Secondary | ICD-10-CM | POA: Diagnosis not present

## 2023-05-24 DIAGNOSIS — D649 Anemia, unspecified: Secondary | ICD-10-CM | POA: Diagnosis not present

## 2023-05-24 DIAGNOSIS — R809 Proteinuria, unspecified: Secondary | ICD-10-CM | POA: Diagnosis not present

## 2023-05-24 DIAGNOSIS — E538 Deficiency of other specified B group vitamins: Secondary | ICD-10-CM | POA: Diagnosis not present

## 2023-05-24 DIAGNOSIS — E1159 Type 2 diabetes mellitus with other circulatory complications: Secondary | ICD-10-CM | POA: Diagnosis not present

## 2023-05-24 DIAGNOSIS — M5442 Lumbago with sciatica, left side: Secondary | ICD-10-CM | POA: Diagnosis not present

## 2023-05-24 DIAGNOSIS — I10 Essential (primary) hypertension: Secondary | ICD-10-CM | POA: Diagnosis not present

## 2023-05-24 DIAGNOSIS — M25562 Pain in left knee: Secondary | ICD-10-CM | POA: Diagnosis not present

## 2023-06-24 DIAGNOSIS — E538 Deficiency of other specified B group vitamins: Secondary | ICD-10-CM | POA: Diagnosis not present

## 2023-08-29 DIAGNOSIS — E559 Vitamin D deficiency, unspecified: Secondary | ICD-10-CM | POA: Diagnosis not present

## 2023-08-29 DIAGNOSIS — D649 Anemia, unspecified: Secondary | ICD-10-CM | POA: Diagnosis not present

## 2023-08-29 DIAGNOSIS — E782 Mixed hyperlipidemia: Secondary | ICD-10-CM | POA: Diagnosis not present

## 2023-08-29 DIAGNOSIS — E538 Deficiency of other specified B group vitamins: Secondary | ICD-10-CM | POA: Diagnosis not present

## 2023-08-29 DIAGNOSIS — E118 Type 2 diabetes mellitus with unspecified complications: Secondary | ICD-10-CM | POA: Diagnosis not present

## 2023-08-31 DIAGNOSIS — H5203 Hypermetropia, bilateral: Secondary | ICD-10-CM | POA: Diagnosis not present

## 2023-09-02 DIAGNOSIS — E118 Type 2 diabetes mellitus with unspecified complications: Secondary | ICD-10-CM | POA: Diagnosis not present

## 2023-09-02 DIAGNOSIS — M5442 Lumbago with sciatica, left side: Secondary | ICD-10-CM | POA: Diagnosis not present

## 2023-09-02 DIAGNOSIS — E538 Deficiency of other specified B group vitamins: Secondary | ICD-10-CM | POA: Diagnosis not present

## 2023-09-02 DIAGNOSIS — E782 Mixed hyperlipidemia: Secondary | ICD-10-CM | POA: Diagnosis not present

## 2023-09-02 DIAGNOSIS — E1169 Type 2 diabetes mellitus with other specified complication: Secondary | ICD-10-CM | POA: Diagnosis not present

## 2023-09-02 DIAGNOSIS — M25562 Pain in left knee: Secondary | ICD-10-CM | POA: Diagnosis not present

## 2023-09-02 DIAGNOSIS — I10 Essential (primary) hypertension: Secondary | ICD-10-CM | POA: Diagnosis not present

## 2023-09-02 DIAGNOSIS — R809 Proteinuria, unspecified: Secondary | ICD-10-CM | POA: Diagnosis not present

## 2023-09-02 DIAGNOSIS — D649 Anemia, unspecified: Secondary | ICD-10-CM | POA: Diagnosis not present

## 2023-11-10 ENCOUNTER — Ambulatory Visit: Admission: EM | Admit: 2023-11-10 | Discharge: 2023-11-10 | Disposition: A | Discharge status: Home / Self Care

## 2023-11-10 ENCOUNTER — Other Ambulatory Visit: Payer: Self-pay

## 2023-11-10 ENCOUNTER — Encounter: Payer: Self-pay | Admitting: Emergency Medicine

## 2023-11-10 DIAGNOSIS — R1030 Lower abdominal pain, unspecified: Secondary | ICD-10-CM

## 2023-11-10 DIAGNOSIS — R739 Hyperglycemia, unspecified: Secondary | ICD-10-CM

## 2023-11-10 DIAGNOSIS — K5900 Constipation, unspecified: Secondary | ICD-10-CM

## 2023-11-10 LAB — POCT FASTING CBG KUC MANUAL ENTRY: POCT Glucose (KUC): 206 mg/dL — AB (ref 70–99)

## 2023-11-10 MED ORDER — POLYETHYLENE GLYCOL 3350 17 GM/SCOOP PO POWD: ORAL | 0 refills | Status: AC

## 2023-11-10 NOTE — ED Notes (Signed)
 Pt stated provider asked him what his blood sugar typically runs day to day after first he could not remember but now recalls it typically running between 140-150 most days.

## 2023-11-10 NOTE — Discharge Instructions (Signed)
 In addition to the MiraLAX  that I have prescribed, you may try Colace, fiber supplements, increasing fluid intake, walking or other exercise.  Go to the emergency department for severe worsening symptoms

## 2023-11-10 NOTE — ED Triage Notes (Signed)
 Pt reports generalized abdominal pain x7 days. Pt reports has been taking otc magnesium citrate, pepto bismol with no relief of abdominal pain/bloating.

## 2023-11-10 NOTE — ED Provider Notes (Signed)
 [facility]    CSN: [id number] Arrival date & time: 11/10/23  1024      History   Chief Complaint Chief Complaint  Patient presents with   Abdominal Pain    HPI Colton Johnson is a 83 y.o. male.   Patient presenting today with about a week of lower abdominal pain, bloating and constipation.  States the week prior he had had some diarrhea so he took some Pepto-Bismol and has since been constipated like this.  Denies fever, chills, vomiting, upper respiratory symptoms, new foods tried, inability to pass gas.  Did pass a small amount of stool several days ago and was able to pass gas this morning.  Trying mag citrate twice throughout the week with no relief.    Past Medical History:  Diagnosis Date   Diabetes mellitus without complication (HCC)    Hypercholesterolemia    Hypertension     There are no active problems to display for this patient.   Past Surgical History:  Procedure Laterality Date   BACK SURGERY         Home Medications    Prior to Admission medications   Medication Sig Start Date End Date Taking? Authorizing Provider  cyanocobalamin  (VITAMIN B12) 1000 MCG/ML injection Inject 1,000 mcg into the skin every 30 (thirty) days. 09/05/23  Yes [provider]  doxylamine, Sleep, (UNISOM) 25 MG tablet Take 1 tablet every day by oral route at bedtime for 30 days. 09/02/23  Yes [provider]  polyethylene glycol powder (MIRALAX ) 17 GM/SCOOP powder Take 3-5 scoops once to twice daily as needed for current constipation, may decrease to one scoop once daily once symptoms resolve 11/10/23  Yes Stuart Vernell Norris, PA-C  traMADol (ULTRAM) 50 MG tablet TAKE 1 TABLET EVERY 8 HOURS AS NEEDED FOR SEVERE PAIN 08/03/19  Yes [provider]  atenolol (TENORMIN) 25 MG tablet Take 25 mg by mouth daily.    [provider]  diazepam  (VALIUM ) 2 MG tablet Take 1 tablet (2 mg total) by mouth every 8 (eight) hours as needed for anxiety.  07/14/19   Dasie Faden, MD  losartan (COZAAR) 25 MG tablet Take 25 mg by mouth daily. for high blood pressure    [provider]  meclizine  (ANTIVERT ) 25 MG tablet Take 1 tablet (25 mg total) by mouth 3 (three) times daily as needed for dizziness. 05/11/17   Zammit, Joseph, MD  metFORMIN (GLUCOPHAGE) 500 MG tablet Take 500 mg by mouth 3 (three) times daily.    [provider]  omeprazole (PRILOSEC) 40 MG capsule omeprazole 40 mg capsule,delayed release    [provider]  ondansetron  (ZOFRAN  ODT) 4 MG disintegrating tablet 4mg  ODT q4 hours prn nausea/vomit 05/11/17   Zammit, Joseph, MD  simvastatin (ZOCOR) 40 MG tablet Take 40 mg by mouth daily.    [provider]  sulfamethoxazole-trimethoprim (BACTRIM DS) 800-160 MG tablet Take 1 tablet by mouth 2 (two) times daily. for 10 days    [provider]  tamsulosin (FLOMAX) 0.4 MG CAPS capsule Take 0.4 mg by mouth daily.    [provider]  triamterene-hydrochlorothiazide (DYAZIDE) 37.5-25 MG capsule Take 1 capsule by mouth daily.    [provider]    Family History Family History  Problem Relation Age of Onset   Diabetes Mother    Heart disease Father    Cancer Brother     Social History Social History   Tobacco Use   Smoking status: Never  Smokeless tobacco: Current    Types: Chew  Vaping Use   Vaping status: Never Used  Substance Use Topics   Alcohol use: No   Drug use: No     Allergies   Aleve [naproxen sodium] and Other   Review of Systems Review of Systems Per HPI  Physical Exam Triage Vital Signs ED Triage Vitals  Encounter Vitals Group     BP 11/10/23 1043 125/65     Girls Systolic BP Percentile --      Girls Diastolic BP Percentile --      Boys Systolic BP Percentile --      Boys Diastolic BP Percentile --      Pulse Rate 11/10/23 1043 90     Resp 11/10/23 1043 20     Temp 11/10/23 1043 98.2 F (36.8 C)     Temp Source 11/10/23 1043 Oral      SpO2 11/10/23 1043 94 %     Weight --      Height --      Head Circumference --      Peak Flow --      Pain Score 11/10/23 1048 10     Pain Loc --      Pain Education --      Exclude from Growth Chart --    No data found.  Updated Vital Signs BP 125/65 (BP Location: Right Arm)   Pulse 90   Temp 98.2 F (36.8 C) (Oral)   Resp 20   SpO2 94%   Visual Acuity Right Eye Distance:   Left Eye Distance:   Bilateral Distance:    Right Eye Near:   Left Eye Near:    Bilateral Near:     Physical Exam Vitals and nursing note reviewed.  Constitutional:      Appearance: Normal appearance.  HENT:     Head: Atraumatic.     Mouth/Throat:     Mouth: Mucous membranes are moist.  Eyes:     Extraocular Movements: Extraocular movements intact.     Conjunctiva/sclera: Conjunctivae normal.  Cardiovascular:     Rate and Rhythm: Normal rate.  Pulmonary:     Effort: Pulmonary effort is normal.  Abdominal:     General: Bowel sounds are normal. There is no distension.     Palpations: Abdomen is soft.     Tenderness: There is no abdominal tenderness. There is no right CVA tenderness, left CVA tenderness or guarding.  Musculoskeletal:        General: Normal range of motion.     Cervical back: Normal range of motion and neck supple.  Skin:    General: Skin is warm and dry.  Neurological:     Mental Status: He is oriented to person, place, and time.  Psychiatric:        Mood and Affect: Mood normal.        Thought Content: Thought content normal.        Judgment: Judgment normal.      UC Treatments / Results  Labs (all labs ordered are listed, but only abnormal results are displayed) Labs Reviewed  POCT FASTING CBG [facility] MANUAL ENTRY - Abnormal; Notable for the following components:      Result Value   POCT Glucose ([facility]) 206 (*)    All other components within normal limits    EKG   Radiology No results found.  Procedures Procedures (including critical care  time)  Medications Ordered in UC Medications - No data to display  Initial Impression / Assessment and Plan / UC Course  I have reviewed the triage vital signs and the nursing notes.  Pertinent labs & imaging results that were available during my care of the patient were reviewed by me and considered in my medical decision making (see chart for details).     Vital signs and exam very reassuring today with no red flag findings.  Random blood sugar was checked as he has been having dry mouth the past few days and wondering if his sugars are to blame.  This was 41 today which he attributes to not having taken his medication yet today, states he did not take his medication this morning and his blood sugars typically run about 140 or 150.  Do not feel his blood sugar is playing a role in his constipation but did discuss continuing tight blood sugar control and increasing fluid intake to keep from dehydrating himself.  Do suspect constipation is the cause of his lower abdominal pain.  Treat with high doses of MiraLAX  as needed until bowels are able to move well, then may back down to 1 capful once to twice daily to keep them moving until feeling better.  May also try Colace, fiber supplements, high-fiber diet.  Follow-up in the emergency department for severe worsening symptoms at any time.  Final Clinical Impressions(s) / UC Diagnoses   Final diagnoses:  Constipation, unspecified constipation type  Lower abdominal pain  Hyperglycemia     Discharge Instructions      In addition to the MiraLAX  that I have prescribed, you may try Colace, fiber supplements, increasing fluid intake, walking or other exercise.  Go to the emergency department for severe worsening symptoms    ED Prescriptions     Medication Sig Dispense Auth. Provider   polyethylene glycol powder (MIRALAX ) 17 GM/SCOOP powder Take 3-5 scoops once to twice daily as needed for current constipation, may decrease to one scoop once  daily once symptoms resolve 255 g Stuart Vernell Norris, PA-C      PDMP not reviewed this encounter.   Stuart Vernell Norris, [place] 11/10/23 1240

## 2023-12-31 ENCOUNTER — Emergency Department (HOSPITAL_COMMUNITY)

## 2023-12-31 ENCOUNTER — Inpatient Hospital Stay (HOSPITAL_COMMUNITY)

## 2023-12-31 ENCOUNTER — Other Ambulatory Visit: Payer: Self-pay

## 2023-12-31 ENCOUNTER — Encounter (HOSPITAL_COMMUNITY): Payer: Self-pay | Admitting: Emergency Medicine

## 2023-12-31 ENCOUNTER — Inpatient Hospital Stay (HOSPITAL_COMMUNITY)
Admission: EM | Admit: 2023-12-31 | Discharge: 2024-01-03 | DRG: 871 | Disposition: A | Discharge status: Home / Self Care | Attending: Internal Medicine | Admitting: Internal Medicine

## 2023-12-31 DIAGNOSIS — R1013 Epigastric pain: Principal | ICD-10-CM

## 2023-12-31 DIAGNOSIS — E1165 Type 2 diabetes mellitus with hyperglycemia: Secondary | ICD-10-CM | POA: Diagnosis not present

## 2023-12-31 DIAGNOSIS — R1110 Vomiting, unspecified: Secondary | ICD-10-CM | POA: Diagnosis present

## 2023-12-31 DIAGNOSIS — M545 Low back pain, unspecified: Secondary | ICD-10-CM | POA: Diagnosis present

## 2023-12-31 DIAGNOSIS — R7401 Elevation of levels of liver transaminase levels: Secondary | ICD-10-CM | POA: Diagnosis present

## 2023-12-31 DIAGNOSIS — I10 Essential (primary) hypertension: Secondary | ICD-10-CM | POA: Diagnosis not present

## 2023-12-31 DIAGNOSIS — G9341 Metabolic encephalopathy: Secondary | ICD-10-CM | POA: Diagnosis not present

## 2023-12-31 DIAGNOSIS — R6520 Severe sepsis without septic shock: Secondary | ICD-10-CM | POA: Diagnosis not present

## 2023-12-31 DIAGNOSIS — Z809 Family history of malignant neoplasm, unspecified: Secondary | ICD-10-CM

## 2023-12-31 DIAGNOSIS — R945 Abnormal results of liver function studies: Secondary | ICD-10-CM | POA: Diagnosis not present

## 2023-12-31 DIAGNOSIS — E872 Acidosis, unspecified: Secondary | ICD-10-CM | POA: Diagnosis not present

## 2023-12-31 DIAGNOSIS — R748 Abnormal levels of other serum enzymes: Secondary | ICD-10-CM

## 2023-12-31 DIAGNOSIS — B179 Acute viral hepatitis, unspecified: Secondary | ICD-10-CM | POA: Diagnosis not present

## 2023-12-31 DIAGNOSIS — E7220 Disorder of urea cycle metabolism, unspecified: Secondary | ICD-10-CM | POA: Insufficient documentation

## 2023-12-31 DIAGNOSIS — Z8249 Family history of ischemic heart disease and other diseases of the circulatory system: Secondary | ICD-10-CM

## 2023-12-31 DIAGNOSIS — R7989 Other specified abnormal findings of blood chemistry: Secondary | ICD-10-CM | POA: Diagnosis present

## 2023-12-31 DIAGNOSIS — G3184 Mild cognitive impairment, so stated: Secondary | ICD-10-CM | POA: Diagnosis present

## 2023-12-31 DIAGNOSIS — Z23 Encounter for immunization: Secondary | ICD-10-CM | POA: Diagnosis not present

## 2023-12-31 DIAGNOSIS — I9589 Other hypotension: Secondary | ICD-10-CM | POA: Diagnosis not present

## 2023-12-31 DIAGNOSIS — A4151 Sepsis due to Escherichia coli [E. coli]: Secondary | ICD-10-CM | POA: Diagnosis not present

## 2023-12-31 DIAGNOSIS — I6523 Occlusion and stenosis of bilateral carotid arteries: Secondary | ICD-10-CM | POA: Diagnosis not present

## 2023-12-31 DIAGNOSIS — K219 Gastro-esophageal reflux disease without esophagitis: Secondary | ICD-10-CM | POA: Diagnosis present

## 2023-12-31 DIAGNOSIS — K7290 Hepatic failure, unspecified without coma: Secondary | ICD-10-CM | POA: Diagnosis present

## 2023-12-31 DIAGNOSIS — B9620 Unspecified Escherichia coli [E. coli] as the cause of diseases classified elsewhere: Secondary | ICD-10-CM

## 2023-12-31 DIAGNOSIS — N400 Enlarged prostate without lower urinary tract symptoms: Secondary | ICD-10-CM | POA: Diagnosis present

## 2023-12-31 DIAGNOSIS — Z1152 Encounter for screening for COVID-19: Secondary | ICD-10-CM

## 2023-12-31 DIAGNOSIS — E782 Mixed hyperlipidemia: Secondary | ICD-10-CM | POA: Diagnosis present

## 2023-12-31 DIAGNOSIS — E538 Deficiency of other specified B group vitamins: Secondary | ICD-10-CM | POA: Diagnosis present

## 2023-12-31 DIAGNOSIS — Z886 Allergy status to analgesic agent status: Secondary | ICD-10-CM

## 2023-12-31 DIAGNOSIS — Z9049 Acquired absence of other specified parts of digestive tract: Secondary | ICD-10-CM | POA: Diagnosis not present

## 2023-12-31 DIAGNOSIS — A4159 Other Gram-negative sepsis: Secondary | ICD-10-CM | POA: Diagnosis present

## 2023-12-31 DIAGNOSIS — Z833 Family history of diabetes mellitus: Secondary | ICD-10-CM | POA: Diagnosis not present

## 2023-12-31 DIAGNOSIS — R935 Abnormal findings on diagnostic imaging of other abdominal regions, including retroperitoneum: Secondary | ICD-10-CM | POA: Diagnosis not present

## 2023-12-31 DIAGNOSIS — Z79899 Other long term (current) drug therapy: Secondary | ICD-10-CM

## 2023-12-31 DIAGNOSIS — N281 Cyst of kidney, acquired: Secondary | ICD-10-CM | POA: Diagnosis not present

## 2023-12-31 DIAGNOSIS — R079 Chest pain, unspecified: Secondary | ICD-10-CM | POA: Diagnosis not present

## 2023-12-31 DIAGNOSIS — R110 Nausea: Secondary | ICD-10-CM | POA: Diagnosis not present

## 2023-12-31 DIAGNOSIS — Z9889 Other specified postprocedural states: Secondary | ICD-10-CM

## 2023-12-31 DIAGNOSIS — Z7984 Long term (current) use of oral hypoglycemic drugs: Secondary | ICD-10-CM

## 2023-12-31 DIAGNOSIS — A419 Sepsis, unspecified organism: Secondary | ICD-10-CM | POA: Diagnosis not present

## 2023-12-31 DIAGNOSIS — R12 Heartburn: Secondary | ICD-10-CM | POA: Diagnosis present

## 2023-12-31 DIAGNOSIS — R7881 Bacteremia: Secondary | ICD-10-CM | POA: Diagnosis not present

## 2023-12-31 DIAGNOSIS — K7682 Hepatic encephalopathy: Secondary | ICD-10-CM | POA: Diagnosis not present

## 2023-12-31 DIAGNOSIS — K5710 Diverticulosis of small intestine without perforation or abscess without bleeding: Secondary | ICD-10-CM | POA: Diagnosis not present

## 2023-12-31 DIAGNOSIS — K7200 Acute and subacute hepatic failure without coma: Secondary | ICD-10-CM

## 2023-12-31 DIAGNOSIS — F17220 Nicotine dependence, chewing tobacco, uncomplicated: Secondary | ICD-10-CM | POA: Diagnosis present

## 2023-12-31 DIAGNOSIS — Z888 Allergy status to other drugs, medicaments and biological substances status: Secondary | ICD-10-CM

## 2023-12-31 DIAGNOSIS — G9389 Other specified disorders of brain: Secondary | ICD-10-CM | POA: Diagnosis not present

## 2023-12-31 LAB — CBC WITH DIFFERENTIAL/PLATELET
Abs Immature Granulocytes: 0.01 K/uL (ref 0.00–0.07)
Basophils Absolute: 0 K/uL (ref 0.0–0.1)
Basophils Relative: 0 %
Eosinophils Absolute: 0 K/uL (ref 0.0–0.5)
Eosinophils Relative: 0 %
HCT: 40.4 % (ref 39.0–52.0)
Hemoglobin: 13.4 g/dL (ref 13.0–17.0)
Immature Granulocytes: 0 %
Lymphocytes Relative: 5 %
Lymphs Abs: 0.3 K/uL — ABNORMAL LOW (ref 0.7–4.0)
MCH: 30.6 pg (ref 26.0–34.0)
MCHC: 33.2 g/dL (ref 30.0–36.0)
MCV: 92.2 fL (ref 80.0–100.0)
Monocytes Absolute: 0.1 K/uL (ref 0.1–1.0)
Monocytes Relative: 1 %
Neutro Abs: 6.8 K/uL (ref 1.7–7.7)
Neutrophils Relative %: 94 %
Platelets: 155 K/uL (ref 150–400)
RBC: 4.38 MIL/uL (ref 4.22–5.81)
RDW: 13.4 % (ref 11.5–15.5)
WBC: 7.3 K/uL (ref 4.0–10.5)
nRBC: 0 % (ref 0.0–0.2)

## 2023-12-31 LAB — URINALYSIS, ROUTINE W REFLEX MICROSCOPIC
Bacteria, UA: NONE SEEN
Bilirubin Urine: NEGATIVE
Glucose, UA: >=500 mg/dL — AB
Ketones, ur: NEGATIVE mg/dL
Leukocytes,Ua: NEGATIVE
Nitrite: NEGATIVE
Protein, ur: 30 mg/dL — AB
Specific Gravity, Urine: 1.037 — ABNORMAL HIGH (ref 1.005–1.030)
pH: 5 (ref 5.0–8.0)

## 2023-12-31 LAB — COMPREHENSIVE METABOLIC PANEL WITH GFR
ALT: 912 U/L — ABNORMAL HIGH (ref 0–44)
AST: 1445 U/L — ABNORMAL HIGH (ref 15–41)
Albumin: 4.2 g/dL (ref 3.5–5.0)
Alkaline Phosphatase: 126 U/L (ref 38–126)
Anion gap: 11 (ref 5–15)
BUN: 27 mg/dL — ABNORMAL HIGH (ref 8–23)
CO2: 22 mmol/L (ref 22–32)
Calcium: 10 mg/dL (ref 8.9–10.3)
Chloride: 104 mmol/L (ref 98–111)
Creatinine, Ser: 0.95 mg/dL (ref 0.61–1.24)
GFR, Estimated: >60 mL/min (ref >60)
Glucose, Bld: 258 mg/dL — ABNORMAL HIGH (ref 70–99)
Potassium: 4.4 mmol/L (ref 3.5–5.1)
Sodium: 137 mmol/L (ref 135–145)
Total Bilirubin: 2.6 mg/dL — ABNORMAL HIGH (ref 0.0–1.2)
Total Protein: 7.3 g/dL (ref 6.5–8.1)

## 2023-12-31 LAB — RESP PANEL BY RT-PCR (RSV, FLU A&B, COVID)  RVPGX2
Influenza A by PCR: NEGATIVE
Influenza B by PCR: NEGATIVE
Resp Syncytial Virus by PCR: NEGATIVE
SARS Coronavirus 2 by RT PCR: NEGATIVE

## 2023-12-31 LAB — RESPIRATORY PANEL BY PCR

## 2023-12-31 LAB — FOLATE: Folate: >20 ng/mL (ref >5.9)

## 2023-12-31 LAB — LACTIC ACID, PLASMA
Lactic Acid, Venous: 2.4 mmol/L (ref 0.5–1.9)
Lactic Acid, Venous: 3 mmol/L (ref 0.5–1.9)
Lactic Acid, Venous: 2.7 mmol/L (ref 0.5–1.9)

## 2023-12-31 LAB — TSH: TSH: 0.618 u[IU]/mL (ref 0.350–4.500)

## 2023-12-31 LAB — GLUCOSE, CAPILLARY
Glucose-Capillary: 250 mg/dL — ABNORMAL HIGH (ref 70–99)
Glucose-Capillary: 194 mg/dL — ABNORMAL HIGH (ref 70–99)

## 2023-12-31 LAB — PROTIME-INR
INR: 1 (ref 0.8–1.2)
Prothrombin Time: 14 s (ref 11.4–15.2)

## 2023-12-31 LAB — TROPONIN I (HIGH SENSITIVITY)
Troponin I (High Sensitivity): 5 ng/L (ref <18)
Troponin I (High Sensitivity): 6 ng/L (ref <18)

## 2023-12-31 LAB — ACETAMINOPHEN LEVEL: Acetaminophen (Tylenol), Serum: <10 ug/mL — ABNORMAL LOW (ref 10–30)

## 2023-12-31 LAB — RAPID URINE DRUG SCREEN, HOSP PERFORMED
Amphetamines: NOT DETECTED
Barbiturates: NOT DETECTED
Benzodiazepines: NOT DETECTED
Cocaine: NOT DETECTED
Opiates: NOT DETECTED
Tetrahydrocannabinol: NOT DETECTED

## 2023-12-31 LAB — HEPATITIS PANEL, ACUTE
HCV Ab: NONREACTIVE
Hep A IgM: NONREACTIVE
Hep B C IgM: NONREACTIVE
Hepatitis B Surface Ag: NONREACTIVE

## 2023-12-31 LAB — ETHANOL: Alcohol, Ethyl (B): <15 mg/dL (ref <15)

## 2023-12-31 LAB — AMMONIA: Ammonia: 52 umol/L — ABNORMAL HIGH (ref 9–35)

## 2023-12-31 LAB — MRSA NEXT GEN BY PCR, NASAL: MRSA by PCR Next Gen: NOT DETECTED

## 2023-12-31 LAB — VITAMIN B12: Vitamin B-12: 1424 pg/mL — ABNORMAL HIGH (ref 180–914)

## 2023-12-31 LAB — T4, FREE: Free T4: 1.06 ng/dL (ref 0.61–1.12)

## 2023-12-31 LAB — LIPASE, BLOOD: Lipase: 48 U/L (ref 11–51)

## 2023-12-31 LAB — SALICYLATE LEVEL: Salicylate Lvl: <7 mg/dL — ABNORMAL LOW (ref 7.0–30.0)

## 2023-12-31 LAB — HEMOGLOBIN A1C
Hgb A1c MFr Bld: 6.4 % — ABNORMAL HIGH (ref 4.8–5.6)
Mean Plasma Glucose: 136.98 mg/dL

## 2023-12-31 LAB — PROCALCITONIN: Procalcitonin: 7.85 ng/mL

## 2023-12-31 MED ORDER — PANTOPRAZOLE SODIUM 40 MG IV SOLR
40.0000 mg | Freq: Once | INTRAVENOUS | Status: AC
  Administered 2023-12-31: 40 mg via INTRAVENOUS
  Filled 2023-12-31: qty 10

## 2023-12-31 MED ORDER — ENOXAPARIN SODIUM 40 MG/0.4ML IJ SOSY
40.0000 mg | PREFILLED_SYRINGE | INTRAMUSCULAR | Status: DC
  Administered 2023-12-31 – 2024-01-02 (×3): 40 mg via SUBCUTANEOUS
  Filled 2023-12-31 (×3): qty 0.4

## 2023-12-31 MED ORDER — ONDANSETRON HCL 4 MG/2ML IJ SOLN: 4.0000 mg | Freq: Four times a day (QID) | INTRAMUSCULAR | Status: DC | PRN

## 2023-12-31 MED ORDER — VANCOMYCIN HCL 1500 MG/300ML IV SOLN
1500.0000 mg | INTRAVENOUS | Status: DC
  Administered 2023-12-31: 1500 mg via INTRAVENOUS
  Filled 2023-12-31: qty 300

## 2023-12-31 MED ORDER — PANTOPRAZOLE SODIUM 40 MG IV SOLR
40.0000 mg | INTRAVENOUS | Status: DC
  Administered 2023-12-31 – 2024-01-02 (×3): 40 mg via INTRAVENOUS
  Filled 2023-12-31 (×3): qty 10

## 2023-12-31 MED ORDER — LACTULOSE 10 GM/15ML PO SOLN
30.0000 g | Freq: Once | ORAL | Status: AC
  Administered 2023-12-31: 30 g via ORAL
  Filled 2023-12-31: qty 60

## 2023-12-31 MED ORDER — LACTATED RINGERS IV BOLUS
1000.0000 mL | Freq: Once | INTRAVENOUS | Status: AC
  Administered 2023-12-31: 1000 mL via INTRAVENOUS

## 2023-12-31 MED ORDER — CHLORHEXIDINE GLUCONATE CLOTH 2 % EX PADS
6.0000 | MEDICATED_PAD | Freq: Every day | CUTANEOUS | Status: DC
  Administered 2024-01-01 – 2024-01-03 (×3): 6 via TOPICAL

## 2023-12-31 MED ORDER — ONDANSETRON HCL 4 MG/2ML IJ SOLN
4.0000 mg | Freq: Once | INTRAMUSCULAR | Status: AC
  Administered 2023-12-31: 4 mg via INTRAVENOUS
  Filled 2023-12-31: qty 2

## 2023-12-31 MED ORDER — LACTATED RINGERS IV SOLN: INTRAVENOUS | Status: AC

## 2023-12-31 MED ORDER — ALUM & MAG HYDROXIDE-SIMETH 200-200-20 MG/5ML PO SUSP
30.0000 mL | Freq: Once | ORAL | Status: AC
  Administered 2023-12-31: 30 mL via ORAL
  Filled 2023-12-31: qty 30

## 2023-12-31 MED ORDER — LIDOCAINE VISCOUS HCL 2 % MT SOLN
15.0000 mL | Freq: Once | OROMUCOSAL | Status: AC
  Administered 2023-12-31: 15 mL via ORAL
  Filled 2023-12-31: qty 15

## 2023-12-31 MED ORDER — ACETAMINOPHEN 325 MG PO TABS
650.0000 mg | ORAL_TABLET | Freq: Four times a day (QID) | ORAL | Status: DC | PRN
  Administered 2023-12-31 – 2024-01-03 (×7): 650 mg via ORAL
  Filled 2023-12-31 (×8): qty 2

## 2023-12-31 MED ORDER — INFLUENZA VAC SPLIT HIGH-DOSE 0.5 ML IM SUSY
0.5000 mL | PREFILLED_SYRINGE | INTRAMUSCULAR | Status: AC
  Administered 2024-01-01: 0.5 mL via INTRAMUSCULAR
  Filled 2023-12-31: qty 0.5

## 2023-12-31 MED ORDER — INSULIN ASPART 100 UNIT/ML IJ SOLN
0.0000 [IU] | Freq: Three times a day (TID) | INTRAMUSCULAR | Status: DC
  Administered 2023-12-31: 3 [IU] via SUBCUTANEOUS
  Administered 2024-01-01 – 2024-01-02 (×3): 2 [IU] via SUBCUTANEOUS
  Administered 2024-01-02: 3 [IU] via SUBCUTANEOUS
  Administered 2024-01-02 – 2024-01-03 (×3): 2 [IU] via SUBCUTANEOUS

## 2023-12-31 MED ORDER — SODIUM CHLORIDE 0.9 % IV SOLN
2.0000 g | Freq: Three times a day (TID) | INTRAVENOUS | Status: DC
  Administered 2023-12-31 – 2024-01-01 (×4): 2 g via INTRAVENOUS
  Filled 2023-12-31 (×4): qty 12.5

## 2023-12-31 MED ORDER — IOHEXOL 300 MG/ML  SOLN
100.0000 mL | Freq: Once | INTRAMUSCULAR | Status: AC | PRN
  Administered 2023-12-31: 100 mL via INTRAVENOUS

## 2023-12-31 MED ORDER — MELATONIN 3 MG PO TABS
6.0000 mg | ORAL_TABLET | Freq: Once | ORAL | Status: AC
  Administered 2023-12-31: 6 mg via ORAL
  Filled 2023-12-31: qty 2

## 2023-12-31 NOTE — ED Provider Notes (Cosign Needed Addendum)
 [place] EMERGENCY DEPARTMENT AT [facility] Provider Note   CSN: [id number] Arrival date & time: 12/31/23  9090     Patient presents with: Heartburn   Colton Johnson is a 83 y.o. male.   Patient is an 83 year old male who presents to the emergency department with a chief complaint of epigastric abdominal pain and nausea.  Patient notes that symptoms have been initially began yesterday.  Patient notes that he has been experiencing dry heaves throughout the night and has been unable to vomit.  He notes that he has had some intermittent radiation of the pain up into his chest.  He notes that he has taken Tums without relief.  Patient denies any associated dizziness, lightheadedness or syncope.  He denies any associated diarrhea or constipation.  There has been no dysuria or hematuria.  He denies any associated shortness of breath.   Heartburn Associated symptoms include abdominal pain.       Prior to Admission medications   Medication Sig Start Date End Date Taking? Authorizing Provider  atenolol (TENORMIN) 25 MG tablet Take 25 mg by mouth daily.    [provider]  cyanocobalamin  (VITAMIN B12) 1000 MCG/ML injection Inject 1,000 mcg into the skin every 30 (thirty) days. 09/05/23   [provider]  diazepam  (VALIUM ) 2 MG tablet Take 1 tablet (2 mg total) by mouth every 8 (eight) hours as needed for anxiety. 07/14/19   Dasie Faden, MD  doxylamine, Sleep, (UNISOM) 25 MG tablet Take 1 tablet every day by oral route at bedtime for 30 days. 09/02/23   [provider]  losartan (COZAAR) 25 MG tablet Take 25 mg by mouth daily. for high blood pressure    [provider]  meclizine  (ANTIVERT ) 25 MG tablet Take 1 tablet (25 mg total) by mouth 3 (three) times daily as needed for dizziness. 05/11/17   Zammit, Joseph, MD  metFORMIN (GLUCOPHAGE) 500 MG tablet Take 500 mg by mouth 3 (three) times daily.    [provider]  omeprazole (PRILOSEC) 40  MG capsule omeprazole 40 mg capsule,delayed release    [provider]  ondansetron  (ZOFRAN  ODT) 4 MG disintegrating tablet 4mg  ODT q4 hours prn nausea/vomit 05/11/17   Zammit, Joseph, MD  polyethylene glycol powder (MIRALAX ) 17 GM/SCOOP powder Take 3-5 scoops once to twice daily as needed for current constipation, may decrease to one scoop once daily once symptoms resolve 11/10/23   Stuart Vernell Norris, PA-C  simvastatin (ZOCOR) 40 MG tablet Take 40 mg by mouth daily.    [provider]  sulfamethoxazole-trimethoprim (BACTRIM DS) 800-160 MG tablet Take 1 tablet by mouth 2 (two) times daily. for 10 days    [provider]  tamsulosin (FLOMAX) 0.4 MG CAPS capsule Take 0.4 mg by mouth daily.    [provider]  traMADol (ULTRAM) 50 MG tablet TAKE 1 TABLET EVERY 8 HOURS AS NEEDED FOR SEVERE PAIN 08/03/19   [provider]  triamterene-hydrochlorothiazide (DYAZIDE) 37.5-25 MG capsule Take 1 capsule by mouth daily.    [provider]    Allergies: Aleve [naproxen sodium] and Other    Review of Systems  Gastrointestinal:  Positive for abdominal pain and heartburn.  All other systems reviewed and are negative.   Updated Vital Signs BP (!) 131/96   Pulse 94   Temp 98.3 F (36.8 C) (Oral)   Resp 16   Ht 6' (1.829 m)   Wt 77.1 kg   SpO2 98%   BMI 23.06 kg/m  Physical Exam Vitals and nursing note reviewed.  Constitutional:      General: He is not in acute distress.    Appearance: Normal appearance. He is not ill-appearing.  HENT:     Head: Normocephalic and atraumatic.     Nose: Nose normal.     Mouth/Throat:     Mouth: Mucous membranes are moist.  Eyes:     Extraocular Movements: Extraocular movements intact.     Conjunctiva/sclera: Conjunctivae normal.     Pupils: Pupils are equal, round, and reactive to light.  Cardiovascular:     Rate and Rhythm: Normal rate and regular rhythm.     Pulses: Normal pulses.     Heart sounds:  Normal heart sounds. No murmur heard.    No gallop.  Pulmonary:     Effort: Pulmonary effort is normal. No respiratory distress.     Breath sounds: Normal breath sounds. No stridor. No wheezing, rhonchi or rales.  Abdominal:     General: Abdomen is flat. Bowel sounds are normal. There is no distension.     Palpations: Abdomen is soft.     Tenderness: There is no guarding.     Comments: Tender to palpation to epigastric region  Musculoskeletal:        General: Normal range of motion.     Cervical back: Normal range of motion and neck supple. No rigidity or tenderness.  Skin:    General: Skin is warm and dry.  Neurological:     General: No focal deficit present.     Mental Status: He is alert and oriented to person, place, and time. Mental status is at baseline.  Psychiatric:        Mood and Affect: Mood normal.        Behavior: Behavior normal.        Thought Content: Thought content normal.        Judgment: Judgment normal.     (all labs ordered are listed, but only abnormal results are displayed) Labs Reviewed  COMPREHENSIVE METABOLIC PANEL WITH GFR  LIPASE, BLOOD  CBC WITH DIFFERENTIAL/PLATELET  URINALYSIS, ROUTINE W REFLEX MICROSCOPIC  TROPONIN I (HIGH SENSITIVITY)    EKG: None  Radiology: No results found.   .Critical Care  Performed by: Daralene Lonni BIRCH, PA-C Authorized by: Daralene Lonni BIRCH, PA-C   Critical care provider statement:    Critical care time (minutes):  40   Critical care was necessary to treat or prevent imminent or life-threatening deterioration of the following conditions:  Hepatic failure   Critical care was time spent personally by me on the following activities:  Development of treatment plan with patient or surrogate, discussions with consultants, evaluation of patient's response to treatment, examination of patient, ordering and review of laboratory studies, ordering and review of radiographic studies, ordering and performing  treatments and interventions, pulse oximetry, re-evaluation of patient's condition and review of old charts   I assumed direction of critical care for this patient from another provider in my specialty: no     Care discussed with: admitting provider      Medications Ordered in the ED  alum & mag hydroxide-simeth (MAALOX/MYLANTA) 200-200-20 MG/5ML suspension 30 mL (has no administration in time range)    And  lidocaine  (XYLOCAINE ) 2 % viscous mouth solution 15 mL (has no administration in time range)  pantoprazole  (PROTONIX ) injection 40 mg (has no administration in time range)  ondansetron  (ZOFRAN ) injection 4 mg (has no administration in time range)  Medical Decision Making Amount and/or Complexity of Data Reviewed Labs: ordered. Radiology: ordered.  Risk OTC drugs. Prescription drug management. Decision regarding hospitalization.   This patient presents to the ED for concern of abdominal pain, nausea and vomiting, this involves an extensive number of treatment options, and is a complaint that carries with it a high risk of complications and morbidity.  The differential diagnosis includes acute appendicitis, cholecystitis, bowel obstruction, diverticulitis, testicular torsion, pyelonephritis, kidney stone, pancreatitis, mesenteric ischemia, hepatic failure, renal failure, electrolyte derangement, cholangitis, choledocholithiasis   Co morbidities that complicate the patient evaluation  Diabetes and hypertension   Additional history obtained:  Additional history obtained from medical records External records from outside source obtained and reviewed including medical records   Lab Tests:  I Ordered, and personally interpreted labs.  The pertinent results include: No leukocytosis, no anemia, normal kidney function, normal electrolytes, hyperglycemia, elevated AST, ALT, total bilirubin, normal lipase, negative troponins, elevated ammonia,  normal Tylenol  and salicylate   Imaging Studies ordered:  I ordered imaging studies including CT scan abdomen and pelvis I independently visualized and interpreted imaging which showed no acute intra-abdominal process I agree with the radiologist interpretation   Cardiac Monitoring: / EKG:  The patient was maintained on a cardiac monitor.  I personally viewed and interpreted the cardiac monitored which showed an underlying rhythm of: Normal sinus rhythm, no ST/T wave changes, no ischemic changes, no STEMI   Consultations Obtained:  I requested consultation with the GI, Dr. Cindie,  and discussed lab and imaging findings as well as pertinent plan - they recommend: Admission, further GI workup   Problem List / ED Course / Critical interventions / Medication management  Patient is doing well at this time and does remain stable.  Discussed with patient that we will plan for admission to the hospital service given his apparent hepatic failure.  CT scan of the abdomen and pelvis was otherwise unremarkable.  Patient has no obvious indication for choledocholithiasis or cholangitis at this point.  He has no leukocytosis and has no associated fever.  Vital signs have remained stable except for some mild intermittent tachycardia.  Did add on ammonia, PT/INR, hepatitis testing.  Patient denies taking any excessive amounts of Tylenol .  Tylenol  levels were negative as well.  Did discuss patient case with Dr. Cindie with gastroenterology who did agree with admission and further workup.  Have discussed patient case with Dr. Evonnie with the hospitalist who has excepted for admission. Patient was fully evaluated by attending physician who is in agreement to plan at this time.  Patient was not initially septic on presentation to the emergency department with no associated tachycardia or fever.  While in the emergency department patient did become tachycardic and febrile after admission was complete to the  hospitalist service.  At that point hospital service did add on antibiotics as well as IV fluids.  Lactic acid was ordered by the hospital service at that point as well. I ordered medication including Protonix , Zofran , GI cocktail, IV fluids for epigastric pain Reevaluation of the patient after these medicines showed that the patient improved I have reviewed the patients home medicines and have made adjustments as needed   Social Determinants of Health:  None    Test / Admission - Considered:  Admission     Final diagnoses:  None    ED Discharge Orders     None          Daralene Lonni BIRCH, PA-C 12/31/23 1410  Daralene Lonni BIRCH, PA-C 12/31/23 1445    Suzette Pac, MD 12/31/23 1646    Daralene Lonni BIRCH, PA-C 12/31/23 CLAIR Suzette Pac, MD 01/01/24 [phone]

## 2023-12-31 NOTE — H&P (Signed)
 History and Physical    Patient: Colton Johnson FMW:[id number] DOB: 04/16/1941 DOA: 12/31/2023 DOS: the patient was seen and examined on 12/31/2023 PCP: Shona Norleen PEDLAR, MD  Patient coming from: Home  Chief Complaint:  Chief Complaint  Patient presents with   Heartburn   HPI: Colton Johnson is a 83 year old male with a history of diabetes mellitus type 2, hypertension, hyperlipidemia, BPH, lumbago, vitamin D deficiency presenting with nausea, epigastric pain, and dry heaving.  The patient is confused and encephalopathic at the time of my evaluation.  History is very limited from the patient.  History is supplemented from the patient's son.  The patient's son has been out of town for the last 1-1/2 months.  He has only been back in town for about a week and has noticed that his dad has had increasing confusion for the past week.  The patient's son states that the patient has had memory issues for the past year.  He is concerned about underlying cognitive impairment/dementia.  However, he does state that the patient has had significantly more confusion in the past week.  The patient has not voiced any particular complaints to him.  However son states that the patient does take a good number of over-the-counter supplements.  His son does not know what exact supplements the patient takes.  Son states that the patient had a previous history of heavy alcohol use, but has not drank in 30 years.  The patient previously smoked heavily but has quit 25 years ago.  He is not aware of any illicit drug use.  Patient's son states that the only complaint that the patient has voiced to him is poorly controlled GERD type symptoms. When asked simple questions, the patient denies any chest pain, shortness breath, abdominal pain, hematemesis, diarrhea. In the ED, the patient was febrile up to 102.2 and tachycardic.  He is hemodynamically stable.  Oxygen saturation 96% room air.  WBC 7.3, hemoglobin 13.4, plates 844.  Sodium  137, potassium 4.4, bicarbonate 22, serum creatinine 0.95.  AST 1445, ALT 912, alk phosphatase 126, total bilirubin 2.6.  Albumin 2.6. Ammonia 52.,  Salicylate level undetectable, acetaminophen  undetectable.  UDS negative.  Alcohol level negative. Troponin 5>> 6 CT brain negative for any acute findings.  CT abdomen and pelvis was negative for any acute findings.  There was no focal liver abnormalities.  The patient is status post cholecystectomy without any bili ductal dilatation.  Right upper quadrant ultrasound was negative for any acute abnormality. INR 1.0  Review of Systems: As mentioned in the history of present illness. All other systems reviewed and are negative. Past Medical History:  Diagnosis Date   Diabetes mellitus without complication (HCC)    Hypercholesterolemia    Hypertension    Past Surgical History:  Procedure Laterality Date   BACK SURGERY     Social History:  reports that he has never smoked. His smokeless tobacco use includes chew. He reports that he does not drink alcohol and does not use drugs.  Allergies  Allergen Reactions   Aleve [Naproxen Sodium] Itching   Other Swelling    Seafood     Family History  Problem Relation Age of Onset   Diabetes Mother    Heart disease Father    Cancer Brother     Prior to Admission medications   Medication Sig Start Date End Date Taking? Authorizing Provider  atenolol (TENORMIN) 50 MG tablet Take 50 mg by mouth daily.    [provider]  cyanocobalamin  (VITAMIN B12) 1000 MCG/ML injection Inject 1,000 mcg into the skin every 30 (thirty) days. 09/05/23   [provider]  diazepam  (VALIUM ) 2 MG tablet Take 1 tablet (2 mg total) by mouth every 8 (eight) hours as needed for anxiety. 07/14/19   Dasie Faden, MD  doxylamine, Sleep, (UNISOM) 25 MG tablet Take 1 tablet every day by oral route at bedtime for 30 days. 09/02/23   [provider]  losartan (COZAAR) 25 MG tablet Take 25 mg by mouth daily. for  high blood pressure    [provider]  meclizine  (ANTIVERT ) 25 MG tablet Take 1 tablet (25 mg total) by mouth 3 (three) times daily as needed for dizziness. 05/11/17   Zammit, Joseph, MD  metFORMIN (GLUCOPHAGE) 1000 MG tablet Take 1,000 mg by mouth 2 (two) times daily.    [provider]  omeprazole (PRILOSEC) 40 MG capsule Take 40 mg by mouth daily.    [provider]  ondansetron  (ZOFRAN  ODT) 4 MG disintegrating tablet 4mg  ODT q4 hours prn nausea/vomit 05/11/17   Zammit, Joseph, MD  polyethylene glycol powder (MIRALAX ) 17 GM/SCOOP powder Take 3-5 scoops once to twice daily as needed for current constipation, may decrease to one scoop once daily once symptoms resolve 11/10/23   Stuart Vernell Norris, PA-C  simvastatin (ZOCOR) 40 MG tablet Take 40 mg by mouth daily.    [provider]  sulfamethoxazole-trimethoprim (BACTRIM DS) 800-160 MG tablet Take 1 tablet by mouth 2 (two) times daily. for 10 days    [provider]  tamsulosin (FLOMAX) 0.4 MG CAPS capsule Take 0.4 mg by mouth daily.    [provider]  traMADol (ULTRAM) 50 MG tablet TAKE 1 TABLET EVERY 8 HOURS AS NEEDED FOR SEVERE PAIN 08/03/19   [provider]  triamterene-hydrochlorothiazide (DYAZIDE) 37.5-25 MG capsule Take 1 capsule by mouth daily.    [provider]    Physical Exam: Vitals:   12/31/23 1145 12/31/23 1215 12/31/23 1300 12/31/23 1411  BP:    (!) 106/57  Pulse:  (!) 105 92 (!) 101  Resp:  20 (!) 23 16  Temp:   100 F (37.8 C) (!) 102.2 F (39 C)  TempSrc:   Oral Oral  SpO2: 95% 96% 94% 94%  Weight:      Height:       GENERAL:  A&O x 3, NAD, well developed, cooperative, follows commands HEENT: [place]/AT, No thrush, No icterus, No oral ulcers Neck:  No neck mass, No meningismus, soft, supple CV: RRR, no S3, no S4, no rub, no JVD Lungs:  CTA, no wheeze, no rhonchi, good air movement Abd: soft/NT +BS, nondistended Ext: No edema, no lymphangitis, no  cyanosis, no rashes Neuro:  CN II-XII intact, strength 4/5 in RUE, RLE, strength 4/5 LUE, LLE; sensation intact bilateral; no dysmetria; babinski equivocal  Data Reviewed: Data reviewed above in history  Assessment and Plan: SIRS - Presented with fever, tachycardia - Workup in progress - Blood cultures x 2 sets - Lactic acid - Procalcitonin - Start IV fluids - Chest x-ray without any consolidations - 12/31/2023 CT AP--negative for acute findings - Start empiric vancomycin  and cefepime   Transaminasemia - Acute viral hepatitis panel - EBV DNA - CMV DNA -Check HIV RNA - Right upper quadrant ultrasound negative - 12/31/2023 CTAP negative for acute findings - INR 1.0 - Trend LFTs - Check COVID - Check viral respiratory panel - Suspect there may be medication effect as the patient is also taking multiple  supplements unknown to his son  Acute metabolic encephalopathy - Secondary to infectious process and hepatic process - B12 - TSH - Ammonia 52 - Repeat ammonia in the morning - Patient has underlying cognitive impairment according to his son - CT brain--neg - UDS--neg  Diabetes mellitus type 2 with hyperglycemia - Holding metformin - Hemoglobin A1c - NovoLog  sliding scale  Mixed hyperlipidemia - Holding Zocor  Essential hypertension - Holding losartan temporarily - Holding atenolol temporarily - Holding triamterene hydrochlorothiazide     Advance Care Planning: FULL  Consults: GI  Family Communication: son 9/6  Severity of Illness: The appropriate patient status for this patient is INPATIENT. Inpatient status is judged to be reasonable and necessary in order to provide the required intensity of service to ensure the patient's safety. The patient's presenting symptoms, physical exam findings, and initial radiographic and laboratory data in the context of their chronic comorbidities is felt to place them at high risk for further clinical deterioration. Furthermore,  it is not anticipated that the patient will be medically stable for discharge from the hospital within 2 midnights of admission.   * I certify that at the point of admission it is my clinical judgment that the patient will require inpatient hospital care spanning beyond 2 midnights from the point of admission due to high intensity of service, high risk for further deterioration and high frequency of surveillance required.*  Author: Alm Schneider, MD 12/31/2023 2:28 PM  For on call review [email or web address].

## 2023-12-31 NOTE — Hospital Course (Signed)
 83 year old male with a history of diabetes mellitus type 2, hypertension, hyperlipidemia, BPH, lumbago, vitamin D deficiency presenting with nausea, epigastric pain, and dry heaving.  The patient is confused and encephalopathic at the time of my evaluation.  History is very limited from the patient.  History is supplemented from the patient's son.  The patient's son has been out of town for the last 1-1/2 months.  He has only been back in town for about a week and has noticed that his dad has had increasing confusion for the past week.  The patient's son states that the patient has had memory issues for the past year.  He is concerned about underlying cognitive impairment/dementia.  However, he does state that the patient has had significantly more confusion in the past week.  The patient has not voiced any particular complaints to him.  However son states that the patient does take a good number of over-the-counter supplements.  His son does not know what exact supplements the patient takes.  Son states that the patient had a previous history of heavy alcohol use, but has not drank in 30 years.  The patient previously smoked heavily but has quit 25 years ago.  He is not aware of any illicit drug use.  Patient's son states that the only complaint that the patient has voiced to him is poorly controlled GERD type symptoms. When asked simple questions, the patient denies any chest pain, shortness breath, abdominal pain, hematemesis, diarrhea. In the ED, the patient was febrile up to 102.2 and tachycardic.  He is hemodynamically stable.  Oxygen saturation 96% room air.  WBC 7.3, hemoglobin 13.4, plates 844.  Sodium 137, potassium 4.4, bicarbonate 22, serum creatinine 0.95.  AST 1445, ALT 912, alk phosphatase 126, total bilirubin 2.6.  Albumin 2.6. Ammonia 52.,  Salicylate level undetectable, acetaminophen  undetectable.  UDS negative.  Alcohol level negative. Troponin 5>> 6 CT brain negative for any acute findings.   CT abdomen and pelvis was negative for any acute findings.  There was no focal liver abnormalities.  The patient is status post cholecystectomy without any bili ductal dilatation.  Right upper quadrant ultrasound was negative for any acute abnormality. INR 1.0

## 2023-12-31 NOTE — Progress Notes (Signed)
 Lactic acid critical of 2.4 called. Notified Dr. Evonnie.

## 2023-12-31 NOTE — ED Notes (Addendum)
 Unable to establish line because the patient jumps and pulls away. Two attempts made to establish line.

## 2023-12-31 NOTE — Progress Notes (Signed)
 Critical lab Lactic called 3.0 notified Dr. Evonnie.

## 2023-12-31 NOTE — Progress Notes (Signed)
 Pharmacy Antibiotic Note  Colton Johnson is a 83 y.o. male admitted on 12/31/2023 with sepsis.  Pharmacy has been consulted for Vancomycin  and cefepime  dosing.  Plan: Vancomycin  1500 mg IV Q 12 hrs. Goal AUC 400-550. Expected AUC: 465 SCr used: 0.95 Cefepime  2gm IV q8h F/U cxs and clinical progress Monitor V/S, labs and levels as indicated   Height: 6' (182.9 cm) Weight: 77.1 kg (170 lb) IBW/kg (Calculated) : 77.6  Temp (24hrs), Avg:100.2 F (37.9 C), Min:98.3 F (36.8 C), Max:102.2 F (39 C)  Recent Labs  Lab 12/31/23 1037  WBC 7.3  CREATININE 0.95    Estimated Creatinine Clearance: 64.3 mL/min (by C-G formula based on SCr of 0.95 mg/dL).    Allergies  Allergen Reactions   Aleve [Naproxen Sodium] Itching   Other Swelling    Seafood     Antimicrobials this admission: Vancomycin  9/6 >>  Cefepime  9/6 >>   Microbiology results: 9/6 BCx: pending 9/6 UCx: pending   MRSA PCR:   Thank you for allowing pharmacy to be a part of this patient's care.  Sahara Fujimoto, BS Pharm D, BCPS Clinical Pharmacist 12/31/2023 2:27 PM

## 2023-12-31 NOTE — ED Triage Notes (Signed)
 Pt states he has had heartburn for the past 2 days and has taken tums with no relief.

## 2024-01-01 DIAGNOSIS — R945 Abnormal results of liver function studies: Secondary | ICD-10-CM

## 2024-01-01 DIAGNOSIS — R748 Abnormal levels of other serum enzymes: Secondary | ICD-10-CM | POA: Diagnosis not present

## 2024-01-01 DIAGNOSIS — I9589 Other hypotension: Secondary | ICD-10-CM

## 2024-01-01 DIAGNOSIS — R7401 Elevation of levels of liver transaminase levels: Secondary | ICD-10-CM | POA: Diagnosis not present

## 2024-01-01 DIAGNOSIS — B179 Acute viral hepatitis, unspecified: Secondary | ICD-10-CM

## 2024-01-01 DIAGNOSIS — R1110 Vomiting, unspecified: Secondary | ICD-10-CM | POA: Diagnosis not present

## 2024-01-01 DIAGNOSIS — E1165 Type 2 diabetes mellitus with hyperglycemia: Secondary | ICD-10-CM | POA: Diagnosis not present

## 2024-01-01 DIAGNOSIS — R1013 Epigastric pain: Principal | ICD-10-CM

## 2024-01-01 LAB — BLOOD CULTURE ID PANEL (REFLEXED) - BCID2

## 2024-01-01 LAB — CBC WITH DIFFERENTIAL/PLATELET
Abs Immature Granulocytes: 0.04 K/uL (ref 0.00–0.07)
Basophils Absolute: 0 K/uL (ref 0.0–0.1)
Basophils Relative: 0 %
Eosinophils Absolute: 0.1 K/uL (ref 0.0–0.5)
Eosinophils Relative: 1 %
HCT: 31.9 % — ABNORMAL LOW (ref 39.0–52.0)
Hemoglobin: 10.9 g/dL — ABNORMAL LOW (ref 13.0–17.0)
Immature Granulocytes: 0 %
Lymphocytes Relative: 8 %
Lymphs Abs: 0.7 K/uL (ref 0.7–4.0)
MCH: 31.3 pg (ref 26.0–34.0)
MCHC: 34.2 g/dL (ref 30.0–36.0)
MCV: 91.7 fL (ref 80.0–100.0)
Monocytes Absolute: 0.4 K/uL (ref 0.1–1.0)
Monocytes Relative: 4 %
Neutro Abs: 8.3 K/uL — ABNORMAL HIGH (ref 1.7–7.7)
Neutrophils Relative %: 87 %
Platelets: 139 K/uL — ABNORMAL LOW (ref 150–400)
RBC: 3.48 MIL/uL — ABNORMAL LOW (ref 4.22–5.81)
RDW: 13.8 % (ref 11.5–15.5)
WBC: 9.6 K/uL (ref 4.0–10.5)
nRBC: 0 % (ref 0.0–0.2)

## 2024-01-01 LAB — COMPREHENSIVE METABOLIC PANEL WITH GFR
ALT: 717 U/L — ABNORMAL HIGH (ref 0–44)
AST: 436 U/L — ABNORMAL HIGH (ref 15–41)
Albumin: 3 g/dL — ABNORMAL LOW (ref 3.5–5.0)
Alkaline Phosphatase: 89 U/L (ref 38–126)
Anion gap: 7 (ref 5–15)
BUN: 24 mg/dL — ABNORMAL HIGH (ref 8–23)
CO2: 23 mmol/L (ref 22–32)
Calcium: 8.6 mg/dL — ABNORMAL LOW (ref 8.9–10.3)
Chloride: 104 mmol/L (ref 98–111)
Creatinine, Ser: 1.05 mg/dL (ref 0.61–1.24)
GFR, Estimated: >60 mL/min (ref >60)
Glucose, Bld: 129 mg/dL — ABNORMAL HIGH (ref 70–99)
Potassium: 3.7 mmol/L (ref 3.5–5.1)
Sodium: 134 mmol/L — ABNORMAL LOW (ref 135–145)
Total Bilirubin: 3.9 mg/dL — ABNORMAL HIGH (ref 0.0–1.2)
Total Protein: 5.4 g/dL — ABNORMAL LOW (ref 6.5–8.1)

## 2024-01-01 LAB — GLUCOSE, CAPILLARY
Glucose-Capillary: 119 mg/dL — ABNORMAL HIGH (ref 70–99)
Glucose-Capillary: 154 mg/dL — ABNORMAL HIGH (ref 70–99)
Glucose-Capillary: 157 mg/dL — ABNORMAL HIGH (ref 70–99)
Glucose-Capillary: 168 mg/dL — ABNORMAL HIGH (ref 70–99)

## 2024-01-01 LAB — AMMONIA: Ammonia: 23 umol/L (ref 9–35)

## 2024-01-01 MED ORDER — SODIUM CHLORIDE 0.9 % IV SOLN
2.0000 g | Freq: Two times a day (BID) | INTRAVENOUS | Status: DC
  Administered 2024-01-02: 2 g via INTRAVENOUS
  Filled 2024-01-01: qty 12.5

## 2024-01-01 NOTE — Progress Notes (Signed)
 Notified Dr. Evonnie of blood culture aerobic bottle gram negative rods.

## 2024-01-01 NOTE — Progress Notes (Signed)
   01/01/24 1611  TOC Brief Assessment  Insurance and Status Reviewed  Patient has primary care physician Yes  Home environment has been reviewed From home  Prior level of function: Inedependent  Prior/Current Home Services No current home services  Social Drivers of Health Review SDOH reviewed interventions complete (smoking cessation added to AVS)  Readmission risk has been reviewed Yes  Transition of care needs no transition of care needs at this time   Transition of Care Department [facility]) has reviewed patient and no TOC needs have been identified at this time. We will continue to monitor patient advancement through interdisciplinary progression rounds. If new patient transition needs arise, please place a TOC consult.

## 2024-01-01 NOTE — Plan of Care (Signed)
  Problem: Education: Goal: Knowledge of General Education information will improve Description: Including pain rating scale, medication(s)/side effects and non-pharmacologic comfort measures Outcome: Progressing   Problem: Health Behavior/Discharge Planning: Goal: Ability to manage health-related needs will improve Outcome: Progressing   Problem: Clinical Measurements: Goal: Ability to maintain clinical measurements within normal limits will improve Outcome: Progressing Goal: Will remain free from infection Outcome: Progressing Goal: Diagnostic test results will improve Outcome: Progressing Goal: Respiratory complications will improve Outcome: Progressing Goal: Cardiovascular complication will be avoided Outcome: Progressing   Problem: Activity: Goal: Risk for activity intolerance will decrease Outcome: Progressing   Problem: Nutrition: Goal: Adequate nutrition will be maintained Outcome: Progressing   Problem: Coping: Goal: Level of anxiety will decrease Outcome: Progressing   Problem: Elimination: Goal: Will not experience complications related to bowel motility Outcome: Progressing Goal: Will not experience complications related to urinary retention Outcome: Progressing   Problem: Pain Managment: Goal: General experience of comfort will improve and/or be controlled Outcome: Progressing   Problem: Safety: Goal: Ability to remain free from injury will improve Outcome: Progressing   Problem: Skin Integrity: Goal: Risk for impaired skin integrity will decrease Outcome: Progressing   Problem: Education: Goal: Ability to describe self-care measures that may prevent or decrease complications (Diabetes Survival Skills Education) will improve Outcome: Progressing Goal: Individualized Educational Video(s) Outcome: Progressing   Problem: Coping: Goal: Ability to adjust to condition or change in health will improve Outcome: Progressing   Problem: Fluid  Volume: Goal: Ability to maintain a balanced intake and output will improve Outcome: Progressing   Problem: Health Behavior/Discharge Planning: Goal: Ability to identify and utilize available resources and services will improve Outcome: Progressing Goal: Ability to manage health-related needs will improve Outcome: Progressing   Problem: Metabolic: Goal: Ability to maintain appropriate glucose levels will improve Outcome: Progressing   Problem: Nutritional: Goal: Maintenance of adequate nutrition will improve Outcome: Progressing   Problem: Skin Integrity: Goal: Risk for impaired skin integrity will decrease Outcome: Progressing   Problem: Tissue Perfusion: Goal: Adequacy of tissue perfusion will improve Outcome: Progressing

## 2024-01-01 NOTE — Consult Note (Signed)
 Consulting  Provider: Dr. Evonnie Primary Care Physician:  Shona Norleen PEDLAR, MD Primary Gastroenterologist: Previously unassigned  Reason for Consultation: Acute hepatitis  HPI:  Colton Johnson is a 83 y.o. male with a past medical history of diabetes, hypertension, dyslipidemia, vitamin D and B12 deficiencies, who presented to [facility], ER yesterday with nausea, epigastric pain and dry heaving.  Patient is not the best historian.  Portion of HPI taken from chart review.  Patient's son reportedly told admitting hospitalist that he has noticed increased confusion and his father over the past week or so.  Reports memory issues for over a year.  In the ER, patient initially triaged with fever and tachycardia prompting SIRS workup.  Found to have elevated LFTs with AST 1445, ALT 912, alk phos 126, T. bili 2.6, ammonia 52.  ASA level undetectable, acetaminophen  level undetectable.  UDS negative.  Alcohol level negative.  CT abdomen pelvis which I personally reviewed was unremarkable.  Right upper quadrant ultrasound also negative for any acute abnormality.  INR 1.0.  Blood work today: AST 436, ALT 717, alk phos 89, T. bili 3.9.  Extensive infectious workup ongoing.  Patient denies any supplements besides B12 and vitamin D3.  Denies recent antibiotics.  States he was placed on a pain pill at 1 point and he laid in bed all day due to side effects so he stopped taking it.  No alcohol use in 30 years.  No exposure to viral hepatitis that he is aware of.  Past Medical History:  Diagnosis Date   Diabetes mellitus without complication (HCC)    Hypercholesterolemia    Hypertension     Past Surgical History:  Procedure Laterality Date   BACK SURGERY      Prior to Admission medications   Medication Sig Start Date End Date Taking? Authorizing Provider  atenolol (TENORMIN) 50 MG tablet Take 50 mg by mouth daily.   Yes [provider]  cyanocobalamin  (VITAMIN B12) 1000 MCG/ML injection  Inject 1,000 mcg into the skin every 30 (thirty) days. 09/05/23  Yes [provider]  losartan (COZAAR) 25 MG tablet Take 25 mg by mouth daily. for high blood pressure   Yes [provider]  metFORMIN (GLUCOPHAGE) 1000 MG tablet Take 1,000 mg by mouth 2 (two) times daily.   Yes [provider]  OMEGA FATTY ACIDS-VITAMINS PO Take 1 capsule by mouth daily. Omega XL - omega 3 fatty acids, DHA, EPA, vit E, olive oil, green lipped mussel oil extract   Yes [provider]  Omega-3 1000 MG CAPS Take 1 capsule by mouth daily.   Yes [provider]  omeprazole (PRILOSEC) 20 MG capsule Take 20 mg by mouth as needed (acid reflux, heartburn).   Yes [provider]  polyethylene glycol powder (MIRALAX ) 17 GM/SCOOP powder Take 3-5 scoops once to twice daily as needed for current constipation, may decrease to one scoop once daily once symptoms resolve Patient taking differently: Take 119 g by mouth 2 (two) times daily as needed for moderate constipation. Take 3-5 scoops once to twice daily as needed for current constipation, may decrease to one scoop once daily once symptoms resolve 11/10/23  Yes Stuart Vernell Norris, PA-C  simvastatin (ZOCOR) 40 MG tablet Take 40 mg by mouth daily.   Yes [provider]  tamsulosin (FLOMAX) 0.4 MG CAPS capsule Take 0.4 mg by mouth daily.   Yes [provider]  traMADol (ULTRAM) 50 MG tablet Take 50 mg by mouth every 8 (eight)  hours as needed for severe pain (pain score 7-10). 08/03/19  Yes [provider]  triamterene-hydrochlorothiazide (DYAZIDE) 37.5-25 MG capsule Take 1 capsule by mouth daily.   Yes [provider]  vitamin B-12 (CYANOCOBALAMIN ) 100 MCG tablet Take 100 mcg by mouth daily.   Yes [provider]    Current Facility-Administered Medications  Medication Dose Route Frequency Provider Last Rate Last Admin   acetaminophen  (TYLENOL ) tablet 650 mg  650 mg Oral Q6H PRN  Tat, David, MD   650 mg at 01/01/24 0404   ceFEPIme  (MAXIPIME ) 2 g in sodium chloride  0.9 % 100 mL IVPB  2 g Intravenous Q8H Tat, David, MD 200 mL/hr at 01/01/24 0555 2 g at 01/01/24 0555   Chlorhexidine  Gluconate Cloth 2 % PADS 6 each  6 each Topical Daily Adefeso, Oladapo, DO       enoxaparin  (LOVENOX ) injection 40 mg  40 mg Subcutaneous Q24H Tat, Alm, MD   40 mg at 12/31/23 2153   Influenza vac split trivalent PF (FLUZONE HIGH-DOSE) injection 0.5 mL  0.5 mL Intramuscular Tomorrow-1000 Tat, Alm, MD       insulin  aspart (novoLOG ) injection 0-9 Units  0-9 Units Subcutaneous TID WC Evonnie Alm, MD   3 Units at 12/31/23 1655   lactated ringers  infusion   Intravenous Continuous Tat, David, MD 125 mL/hr at 12/31/23 2152 New Bag at 12/31/23 2152   ondansetron  (ZOFRAN ) injection 4 mg  4 mg Intravenous Q6H PRN Tat, Alm, MD       pantoprazole  (PROTONIX ) injection 40 mg  40 mg Intravenous Q24H Evonnie Alm, MD   40 mg at 12/31/23 2141    Allergies as of 12/31/2023 - Review Complete 12/31/2023  Allergen Reaction Noted   Aleve [naproxen sodium] Itching 05/11/2017   Other Swelling 09/12/2011    Family History  Problem Relation Age of Onset   Diabetes Mother    Heart disease Father    Cancer Brother     Social History   Socioeconomic History   Marital status: Divorced    Spouse name: Not on file   Number of children: Not on file   Years of education: Not on file   Highest education level: Not on file  Occupational History   Not on file  Tobacco Use   Smoking status: Never   Smokeless tobacco: Current    Types: Chew  Vaping Use   Vaping status: Never Used  Substance and Sexual Activity   Alcohol use: No   Drug use: No   Sexual activity: Not on file  Other Topics Concern   Not on file  Social History Narrative   Not on file   Social Drivers of Health   Financial Resource Strain: Not on file  Food Insecurity: No Food Insecurity (12/31/2023)   Hunger Vital Sign    Worried About  Running Out of Food in the Last Year: Never true    Ran Out of Food in the Last Year: Never true  Transportation Needs: No Transportation Needs (12/31/2023)   PRAPARE - Administrator, Civil Service (Medical): No    Lack of Transportation (Non-Medical): No  Physical Activity: Not on file  Stress: Not on file  Social Connections: Moderately Isolated (12/31/2023)   Social Connection and Isolation Panel    Frequency of Communication with Friends and Family: Once a week    Frequency of Social Gatherings with Friends and Family: Never    Attends Religious Services: 1 to 4 times per year  Active Member of Clubs or Organizations: No    Attends Banker Meetings: 1 to 4 times per year    Marital Status: Divorced  Intimate Partner Violence: Not At Risk (12/31/2023)   Humiliation, Afraid, Rape, and Kick questionnaire    Fear of Current or Ex-Partner: No    Emotionally Abused: No    Physically Abused: No    Sexually Abused: No    Review of Systems: General: Negative for anorexia, weight loss, fever, chills, fatigue, weakness. Eyes: Negative for vision changes.  ENT: Negative for hoarseness, difficulty swallowing , nasal congestion. CV: Negative for chest pain, angina, palpitations, dyspnea on exertion, peripheral edema.  Respiratory: Negative for dyspnea at rest, dyspnea on exertion, cough, sputum, wheezing.  GI: See history of present illness. GU:  Negative for dysuria, hematuria, urinary incontinence, urinary frequency, nocturnal urination.  MS: Negative for joint pain, low back pain.  Derm: Negative for rash or itching.  Neuro: Negative for weakness, abnormal sensation, seizure, frequent headaches, memory loss, confusion.  Psych: Negative for anxiety, depression Endo: Negative for unusual weight change.  Heme: Negative for bruising or bleeding. Allergy: Negative for rash or hives.  Physical Exam: Vital signs in last 24 hours: Temp:  [98.1 F (36.7 C)-102.2 F (39  C)] 98.1 F (36.7 C) (09/07 0709) Pulse Rate:  [56-105] 56 (09/07 0815) Resp:  [16-27] 19 (09/07 0815) BP: (90-144)/(41-68) 128/55 (09/07 0815) SpO2:  [94 %-99 %] 98 % (09/07 0815) Weight:  [71.7 kg] 71.7 kg (09/06 1457) Last BM Date : 12/31/23 General:   Alert,  Well-developed, well-nourished, pleasant and cooperative in NAD Head:  Normocephalic and atraumatic. Eyes:  Sclera clear, no icterus.   Conjunctiva pink. Ears:  Normal auditory acuity. Nose:  No deformity, discharge,  or lesions. Mouth:  No deformity or lesions, dentition normal. Neck:  Supple; no masses or thyromegaly. Lungs:  Clear throughout to auscultation.   No wheezes, crackles, or rhonchi. No acute distress. Heart:  Regular rate and rhythm; no murmurs, clicks, rubs,  or gallops. Abdomen:  Soft, nontender and nondistended. No masses, hepatosplenomegaly or hernias noted. Normal bowel sounds, without guarding, and without rebound.   Msk:  Symmetrical without gross deformities. Normal posture. Pulses:  Normal pulses noted. Extremities:  Without clubbing or edema. Neurologic:  Alert and  oriented x4;  grossly normal neurologically. Skin:  Intact without significant lesions or rashes. Cervical Nodes:  No significant cervical adenopathy. Psych:  Alert and cooperative. Normal mood and affect.  Intake/Output from previous day: 09/06 0701 - 09/07 0700 In: 1326.9 [P.O.:100; IV Piggyback:1226.9] Out: 100 [Urine:100] Intake/Output this shift: No intake/output data recorded.  Lab Results: Recent Labs    12/31/23 1037 01/01/24 0505  WBC 7.3 9.6  HGB 13.4 10.9*  HCT 40.4 31.9*  PLT 155 139*   BMET Recent Labs    12/31/23 1037 01/01/24 0505  NA 137 134*  K 4.4 3.7  CL 104 104  CO2 22 23  GLUCOSE 258* 129*  BUN 27* 24*  CREATININE 0.95 1.05  CALCIUM 10.0 8.6*   LFT Recent Labs    12/31/23 1037 01/01/24 0505  PROT 7.3 5.4*  ALBUMIN 4.2 3.0*  AST 1,445* 436*  ALT 912* 717*  ALKPHOS 126 89  BILITOT 2.6*  3.9*   PT/INR Recent Labs    12/31/23 1317  LABPROT 14.0  INR 1.0   Hepatitis Panel Recent Labs    12/31/23 1055  HEPBSAG NON REACTIVE  HCVAB NON REACTIVE  HEPAIGM NON REACTIVE  HEPBIGM NON  REACTIVE   C-Diff No results for input(s): CDIFFTOX in the last 72 hours.  Studies/Results: CT HEAD WO CONTRAST ( ) Result Date: 12/31/2023 CLINICAL DATA:  Epigastric abdominal pain and nausea. EXAM: CT HEAD WITHOUT CONTRAST TECHNIQUE: Contiguous axial images were obtained from the base of the skull through the vertex without intravenous contrast. RADIATION DOSE REDUCTION: This exam was performed according to the departmental dose-optimization program which includes automated exposure control, adjustment of the mA and/or kV according to patient size and/or use of iterative reconstruction technique. COMPARISON:  May 11, 2017 FINDINGS: Brain: There is generalized cerebral atrophy with widening of the extra-axial spaces and ventricular dilatation. There are areas of decreased attenuation within the white matter tracts of the supratentorial brain, consistent with microvascular disease changes. Vascular: Moderate severity bilateral cavernous carotid artery calcification is noted. Skull: Normal. Negative for fracture or focal lesion. Sinuses/Orbits: No acute finding. Other: None. IMPRESSION: 1. Generalized cerebral atrophy and microvascular disease changes of the supratentorial brain. 2. No acute intracranial abnormality. Electronically Signed   By: Suzen Dials M.D.   On: 12/31/2023 15:00   US  Abdomen Limited RUQ (LIVER/GB) Result Date: 12/31/2023 CLINICAL DATA:  Nausea for several hours. EXAM: ULTRASOUND ABDOMEN LIMITED RIGHT UPPER QUADRANT COMPARISON:  CT scan of same day. FINDINGS: Gallbladder: Status post cholecystectomy. Common bile duct: Diameter: 6 mm which is within normal limits. Liver: No focal lesion identified. Within normal limits in parenchymal echogenicity. Portal vein is patent on  color Doppler imaging with normal direction of blood flow towards the liver. Other: None. IMPRESSION: Status post cholecystectomy. No other abnormality seen in the right upper quadrant of the abdomen. Electronically Signed   By: Lynwood Landy Raddle M.D.   On: 12/31/2023 14:28   CT ABDOMEN PELVIS W CONTRAST Result Date: 12/31/2023 CLINICAL DATA:  Epigastric abdominal pain EXAM: CT ABDOMEN AND PELVIS WITH CONTRAST TECHNIQUE: Multidetector CT imaging of the abdomen and pelvis was performed using the standard protocol following bolus administration of intravenous contrast. RADIATION DOSE REDUCTION: This exam was performed according to the departmental dose-optimization program which includes automated exposure control, adjustment of the mA and/or kV according to patient size and/or use of iterative reconstruction technique. CONTRAST:  100mL OMNIPAQUE  IOHEXOL  300 MG/ML  SOLN COMPARISON:  None Available. FINDINGS: Motion artifact limits evaluation of the lower abdomen. Lower chest: Bibasilar subsegmental atelectasis back Hepatobiliary: No focal liver abnormality is seen. Status post cholecystectomy. No biliary dilatation. Pancreas: Unremarkable. Spleen: Unremarkable. Adrenals/Urinary Tract: 1.6 cm left adrenal nodule, indeterminate. Bilateral renal cysts and additional subcentimeter renal hypodensities that are too small to characterize. No nephrolithiasis. No hydronephrosis. Stomach/Bowel: No evidence of bowel obstruction or inflammation. Appendix is unremarkable. Duodenal diverticulum. Vascular/Lymphatic: Normal caliber aorta. No lymphadenopathy by size criteria. Reproductive: Unremarkable. Other: No free air or free fluid. Musculoskeletal: Multilevel degenerative vertebral changes with grade 1 anterolisthesis of L4 on L5. IMPRESSION: 1.  No acute pathology in the abdomen or pelvis. 2. 1.6 cm indeterminate left adrenal nodule, probably benign. Consider follow-up 12 month adrenal CT for further evaluation. Electronically  Signed   By: Michaeline Blanch M.D.   On: 12/31/2023 12:32   DG Chest Port 1 View Result Date: 12/31/2023 EXAM: 1 VIEW XRAY OF THE CHEST 12/31/2023 10:11:00 AM COMPARISON: AP radiograph of the chest dated 05/11/2017. CLINICAL HISTORY: CP. Per chart: Pt states he has had heartburn for the past 2 days and has taken tums with no relief; Hx HTN FINDINGS: LUNGS AND PLEURA: No focal pulmonary opacity. No pulmonary edema. No pleural effusion. No  pneumothorax. HEART AND MEDIASTINUM: No acute abnormality of the cardiac and mediastinal silhouettes. Moderate calcification of the aortic arch. BONES AND SOFT TISSUES: No acute osseous abnormality. IMPRESSION: 1. No acute findings. Electronically signed by: Evalene Coho MD 12/31/2023 10:16 AM EDT RP Workstation: [id number]    Assessment: *Acute hepatitis *Elevated LFTs  Plan: Etiology of patient's acute hepatitis likely related to underlying acute infectious process vs ischemic hepatopathy in the setting of transient hypotension.  Has documented blood pressure as low as 80/39 yesterday. Lactic as high as 3.   Unclear on what supplements patient is taking besides B12 and vitamin D.  He denies any turmeric, ashwagandha, green tea extract.  Denies any recent antibiotics.  No acetaminophen  use.  No alcohol use for over 20 years.  ASA, acetaminophen  levels WNL. Viral hepatitis panel negative. UDS negative. CMV, EBV, HIV pending.  Will add autoimmune and iron studies.  CT and US  reassuring.   Consider 2D echocardiogram.   Otherwise supportive care.   Carlin POUR. Cindie, D.O. Gastroenterology and Hepatology [facility]    LOS: 1 day     01/01/2024, 11:23 AM

## 2024-01-01 NOTE — Progress Notes (Signed)
 PROGRESS NOTE  Colton Johnson FMW:[id number] DOB: 1940-07-26 DOA: 12/31/2023 PCP: Shona Norleen PEDLAR, MD  Brief History:  83 year old male with a history of diabetes mellitus type 2, hypertension, hyperlipidemia, BPH, lumbago, vitamin D deficiency presenting with nausea, epigastric pain, and dry heaving.  The patient is confused and encephalopathic at the time of my evaluation.  History is very limited from the patient.  History is supplemented from the patient's son.  The patient's son has been out of town for the last 1-1/2 months.  He has only been back in town for about a week and has noticed that his dad has had increasing confusion for the past week.  The patient's son states that the patient has had memory issues for the past year.  He is concerned about underlying cognitive impairment/dementia.  However, he does state that the patient has had significantly more confusion in the past week.  The patient has not voiced any particular complaints to him.  However son states that the patient does take a good number of over-the-counter supplements.  His son does not know what exact supplements the patient takes.  Son states that the patient had a previous history of heavy alcohol use, but has not drank in 30 years.  The patient previously smoked heavily but has quit 25 years ago.  He is not aware of any illicit drug use.  Patient's son states that the only complaint that the patient has voiced to him is poorly controlled GERD type symptoms. When asked simple questions, the patient denies any chest pain, shortness breath, abdominal pain, hematemesis, diarrhea. In the ED, the patient was febrile up to 102.2 and tachycardic.  He is hemodynamically stable.  Oxygen saturation 96% room air.  WBC 7.3, hemoglobin 13.4, plates 844.  Sodium 137, potassium 4.4, bicarbonate 22, serum creatinine 0.95.  AST 1445, ALT 912, alk phosphatase 126, total bilirubin 2.6.  Albumin 2.6. Ammonia 52.,  Salicylate level  undetectable, acetaminophen  undetectable.  UDS negative.  Alcohol level negative. Troponin 5>> 6 CT brain negative for any acute findings.  CT abdomen and pelvis was negative for any acute findings.  There was no focal liver abnormalities.  The patient is status post cholecystectomy without any bili ductal dilatation.  Right upper quadrant ultrasound was negative for any acute abnormality. INR 1.0   Assessment/Plan: Severe sepsis - Present on admission - Secondary to bacteremia - Lactic acid peaked 3.0 - Procalcitonin is 0.5 - Continue cefepime  - Continue IV fluids - Follow blood cultures - UA negative for pyuria - Personally reviewed chest x-ray--no infiltrates or edema  Gram-negative bacteremia - Discontinue vancomycin  - Continue cefepime   Transaminasemia - Acute viral hepatitis panel--negative - EBV DNA - CMV DNA -Check HIV RNA - Right upper quadrant ultrasound negative - 12/31/2023 CTAP negative for acute findings - INR 1.0 - Trend LFTs--trending down - Check COVID--neg - Check viral respiratory panel--neg - Suspect sepsis vs medication effect as the patient is also taking multiple supplements unknown to his son   Acute metabolic encephalopathy - Secondary to infectious process and hepatic process - B12--1424 - TSH--0.618 - Ammonia 52>>23 - Repeat ammonia in the morning - Patient has underlying cognitive impairment according to his son - CT brain--neg - UDS--neg   Diabetes mellitus type 2 with hyperglycemia - Holding metformin - Hemoglobin A1c 6.4 - NovoLog  sliding scale   Mixed hyperlipidemia - Holding Zocor   Essential hypertension - Holding losartan temporarily - Holding atenolol  temporarily - Holding triamterene hydrochlorothiazide     Family Communication:   son 9/6  Consultants:  GI  Code Status:  FULL  DVT Prophylaxis:   [place] Lovenox    Procedures: As Listed in Progress Note Above  Antibiotics: Cefepime  9/6>> Vanc  9/6>>9/7      Subjective: Patient denies fevers, chills, headache, chest pain, dyspnea, nausea, vomiting, diarrhea, abdominal pain, dysuria, hematuria, hematochezia, and melena.   Objective: Vitals:   01/01/24 0500 01/01/24 0709 01/01/24 0815 01/01/24 1140  BP: (!) 118/47  (!) 128/55   Pulse: 61  (!) 56   Resp: (!) 22  19   Temp:  98.1 F (36.7 C)  97.8 F (36.6 C)  TempSrc:  Oral  Oral  SpO2: 98%  98%   Weight:      Height:        Intake/Output Summary (Last 24 hours) at 01/01/2024 1256 Last data filed at 01/01/2024 1216 Gross per 24 hour  Intake 1326.88 ml  Output 300 ml  Net 1026.88 ml   Weight change:  Exam:  General:  Pt is alert, follows commands appropriately, not in acute distress HEENT: No icterus, No thrush, No neck mass, [place]/AT Cardiovascular: RRR, S1/S2, no rubs, no gallops Respiratory: CTA bilaterally, no wheezing, no crackles, no rhonchi Abdomen: Soft/+BS, non tender, non distended, no guarding Extremities: No edema, No lymphangitis, No petechiae, No rashes, no synovitis   Data Reviewed: I have personally reviewed following labs and imaging studies Basic Metabolic Panel: Recent Labs  Lab 12/31/23 1037 01/01/24 0505  NA 137 134*  K 4.4 3.7  CL 104 104  CO2 22 23  GLUCOSE 258* 129*  BUN 27* 24*  CREATININE 0.95 1.05  CALCIUM 10.0 8.6*   Liver Function Tests: Recent Labs  Lab 12/31/23 1037 01/01/24 0505  AST 1,445* 436*  ALT 912* 717*  ALKPHOS 126 89  BILITOT 2.6* 3.9*  PROT 7.3 5.4*  ALBUMIN 4.2 3.0*   Recent Labs  Lab 12/31/23 1037  LIPASE 48   Recent Labs  Lab 12/31/23 1249 01/01/24 0505  AMMONIA 52* 23   Coagulation Profile: Recent Labs  Lab 12/31/23 1317  INR 1.0   CBC: Recent Labs  Lab 12/31/23 1037 01/01/24 0505  WBC 7.3 9.6  NEUTROABS 6.8 8.3*  HGB 13.4 10.9*  HCT 40.4 31.9*  MCV 92.2 91.7  PLT 155 139*   Cardiac Enzymes: No results for input(s): CKTOTAL, CKMB, CKMBINDEX, TROPONINI in the last  168 hours. BNP: Invalid input(s): POCBNP CBG: Recent Labs  Lab 12/31/23 1606 12/31/23 2109 01/01/24 0710 01/01/24 1129  GLUCAP 250* 194* 119* 154*   HbA1C: Recent Labs    12/31/23 1409  HGBA1C 6.4*   Urine analysis:    Component Value Date/Time   COLORURINE YELLOW 12/31/2023 0943   APPEARANCEUR CLEAR 12/31/2023 0943   LABSPEC 1.037 (H) 12/31/2023 0943   PHURINE 5.0 12/31/2023 0943   GLUCOSEU >=500 (A) 12/31/2023 0943   HGBUR SMALL (A) 12/31/2023 0943   BILIRUBINUR NEGATIVE 12/31/2023 0943   KETONESUR NEGATIVE 12/31/2023 0943   PROTEINUR 30 (A) 12/31/2023 0943   NITRITE NEGATIVE 12/31/2023 0943   LEUKOCYTESUR NEGATIVE 12/31/2023 0943   Sepsis Labs: @LABRCNTIP (procalcitonin:4,lacticidven:4) ) Recent Results (from the past 240 hours)  Culture, blood (Routine X 2) w Reflex to ID Panel     Status: None (Preliminary result)   Collection Time: 12/31/23  1:15 PM   Specimen: Right Antecubital; Blood  Result Value Ref Range Status   Specimen Description   Final  RIGHT ANTECUBITAL BOTTLES DRAWN AEROBIC AND ANAEROBIC   Special Requests Blood Culture adequate volume  Final   Culture  Setup Time   Final    GRAM NEGATIVE RODS AEROBIC BOTTLE ONLY Gram Stain Report Called to,Read Back By and Verified With: L. HILLTON ON 01/01/2024 @10 :31AM BY IVAR ECHEVARIA  Performed at [facility], [address], [place], [place] [address]    Culture GRAM NEGATIVE RODS  Final   Report Status PENDING  Incomplete  MRSA Next Gen by PCR, Nasal     Status: None   Collection Time: 12/31/23  2:32 PM   Specimen: Nasal Mucosa; Nasal Swab  Result Value Ref Range Status   MRSA by PCR Next Gen NOT DETECTED NOT DETECTED Final    Comment: (NOTE) The GeneXpert MRSA Assay (FDA approved for NASAL specimens only), is one component of a comprehensive MRSA colonization surveillance program. It is not intended to diagnose MRSA infection nor to guide or monitor treatment for MRSA infections. Test performance is  not FDA approved in patients less than 23 years old. Performed at [facility], [address] [place], [place] [address]   Resp panel by RT-PCR (RSV, Flu A&B, Covid) Anterior Nasal Swab     Status: None   Collection Time: 12/31/23  2:33 PM   Specimen: Anterior Nasal Swab  Result Value Ref Range Status   SARS Coronavirus 2 by RT PCR NEGATIVE NEGATIVE Final    Comment: (NOTE) SARS-CoV-2 target nucleic acids are NOT DETECTED.  The SARS-CoV-2 RNA is generally detectable in upper respiratory specimens during the acute phase of infection. The lowest concentration of SARS-CoV-2 viral copies this assay can detect is 138 copies/mL. A negative result does not preclude SARS-Cov-2 infection and should not be used as the sole basis for treatment or other patient management decisions. A negative result may occur with  improper specimen collection/handling, submission of specimen other than nasopharyngeal swab, presence of viral mutation(s) within the areas targeted by this assay, and inadequate number of viral copies(<138 copies/mL). A negative result must be combined with clinical observations, patient history, and epidemiological information. The expected result is Negative.  Fact Sheet for Patients:  BloggerCourse.com  Fact Sheet for Healthcare Providers:  SeriousBroker.it  This test is no t yet approved or cleared by the [place]  FDA and  has been authorized for detection and/or diagnosis of SARS-CoV-2 by FDA under an Emergency Use Authorization (EUA). This EUA will remain  in effect (meaning this test can be used) for the duration of the COVID-19 declaration under Section 564(b)(1) of the Act, 21 U.S.C.section 360bbb-3(b)(1), unless the authorization is terminated  or revoked sooner.       Influenza A by PCR NEGATIVE NEGATIVE Final   Influenza B by PCR NEGATIVE NEGATIVE Final    Comment: (NOTE) The Xpert Xpress  SARS-CoV-2/FLU/RSV plus assay is intended as an aid in the diagnosis of influenza from Nasopharyngeal swab specimens and should not be used as a sole basis for treatment. Nasal washings and aspirates are unacceptable for Xpert Xpress SARS-CoV-2/FLU/RSV testing.  Fact Sheet for Patients: BloggerCourse.com  Fact Sheet for Healthcare Providers: SeriousBroker.it  This test is not yet approved or cleared by the [place]  FDA and has been authorized for detection and/or diagnosis of SARS-CoV-2 by FDA under an Emergency Use Authorization (EUA). This EUA will remain in effect (meaning this test can be used) for the duration of the COVID-19 declaration under Section 564(b)(1) of the Act, 21 U.S.C. section 360bbb-3(b)(1), unless the authorization is  terminated or revoked.     Resp Syncytial Virus by PCR NEGATIVE NEGATIVE Final    Comment: (NOTE) Fact Sheet for Patients: BloggerCourse.com  Fact Sheet for Healthcare Providers: SeriousBroker.it  This test is not yet approved or cleared by the [place]  FDA and has been authorized for detection and/or diagnosis of SARS-CoV-2 by FDA under an Emergency Use Authorization (EUA). This EUA will remain in effect (meaning this test can be used) for the duration of the COVID-19 declaration under Section 564(b)(1) of the Act, 21 U.S.C. section 360bbb-3(b)(1), unless the authorization is terminated or revoked.  Performed at [facility], [address], [place], [place] [address]   Respiratory (~20 pathogens) panel by PCR     Status: None   Collection Time: 12/31/23  3:04 PM   Specimen: Nasopharyngeal Swab; Respiratory  Result Value Ref Range Status   Adenovirus NOT DETECTED NOT DETECTED Final   Coronavirus 229E NOT DETECTED NOT DETECTED Final    Comment: (NOTE) The Coronavirus on the Respiratory Panel, DOES NOT test for the novel   Coronavirus (2019 nCoV)    Coronavirus HKU1 NOT DETECTED NOT DETECTED Final   Coronavirus NL63 NOT DETECTED NOT DETECTED Final   Coronavirus OC43 NOT DETECTED NOT DETECTED Final   Metapneumovirus NOT DETECTED NOT DETECTED Final   Rhinovirus / Enterovirus NOT DETECTED NOT DETECTED Final   Influenza A NOT DETECTED NOT DETECTED Final   Influenza B NOT DETECTED NOT DETECTED Final   Parainfluenza Virus 1 NOT DETECTED NOT DETECTED Final   Parainfluenza Virus 2 NOT DETECTED NOT DETECTED Final   Parainfluenza Virus 3 NOT DETECTED NOT DETECTED Final   Parainfluenza Virus 4 NOT DETECTED NOT DETECTED Final   Respiratory Syncytial Virus NOT DETECTED NOT DETECTED Final   Bordetella pertussis NOT DETECTED NOT DETECTED Final   Bordetella Parapertussis NOT DETECTED NOT DETECTED Final   Chlamydophila pneumoniae NOT DETECTED NOT DETECTED Final   Mycoplasma pneumoniae NOT DETECTED NOT DETECTED Final    Comment: Performed at [facility], [address] [place], [place] [address]  Culture, blood (Routine X 2) w Reflex to ID Panel     Status: None (Preliminary result)   Collection Time: 12/31/23  3:37 PM   Specimen: BLOOD  Result Value Ref Range Status   Specimen Description BLOOD RIGHT ANTECUBITAL  Final   Special Requests   Final    BOTTLES DRAWN AEROBIC AND ANAEROBIC Blood Culture adequate volume   Culture   Final    NO GROWTH < 24 HOURS Performed at [facility], [address] [place], [place] [address]    Report Status PENDING  Incomplete     Scheduled Meds:  Chlorhexidine  Gluconate Cloth  6 each Topical Daily   enoxaparin  (LOVENOX ) injection  40 mg Subcutaneous Q24H   insulin  aspart  0-9 Units Subcutaneous TID WC   pantoprazole  (PROTONIX ) IV  40 mg Intravenous Q24H   Continuous Infusions:  ceFEPime  (MAXIPIME ) IV 2 g (01/01/24 0555)   lactated ringers  125 mL/hr at 12/31/23 2152    Procedures/Studies: CT HEAD WO CONTRAST ( ) Result Date: 12/31/2023 CLINICAL DATA:  Epigastric  abdominal pain and nausea. EXAM: CT HEAD WITHOUT CONTRAST TECHNIQUE: Contiguous axial images were obtained from the base of the skull through the vertex without intravenous contrast. RADIATION DOSE REDUCTION: This exam was performed according to the departmental dose-optimization program which includes automated exposure control, adjustment of the mA and/or kV according to patient size and/or use of iterative reconstruction technique. COMPARISON:  May 11, 2017 FINDINGS:  Brain: There is generalized cerebral atrophy with widening of the extra-axial spaces and ventricular dilatation. There are areas of decreased attenuation within the white matter tracts of the supratentorial brain, consistent with microvascular disease changes. Vascular: Moderate severity bilateral cavernous carotid artery calcification is noted. Skull: Normal. Negative for fracture or focal lesion. Sinuses/Orbits: No acute finding. Other: None. IMPRESSION: 1. Generalized cerebral atrophy and microvascular disease changes of the supratentorial brain. 2. No acute intracranial abnormality. Electronically Signed   By: Suzen Dials M.D.   On: 12/31/2023 15:00   US  Abdomen Limited RUQ (LIVER/GB) Result Date: 12/31/2023 CLINICAL DATA:  Nausea for several hours. EXAM: ULTRASOUND ABDOMEN LIMITED RIGHT UPPER QUADRANT COMPARISON:  CT scan of same day. FINDINGS: Gallbladder: Status post cholecystectomy. Common bile duct: Diameter: 6 mm which is within normal limits. Liver: No focal lesion identified. Within normal limits in parenchymal echogenicity. Portal vein is patent on color Doppler imaging with normal direction of blood flow towards the liver. Other: None. IMPRESSION: Status post cholecystectomy. No other abnormality seen in the right upper quadrant of the abdomen. Electronically Signed   By: Lynwood Landy Raddle M.D.   On: 12/31/2023 14:28   CT ABDOMEN PELVIS W CONTRAST Result Date: 12/31/2023 CLINICAL DATA:  Epigastric abdominal pain EXAM: CT  ABDOMEN AND PELVIS WITH CONTRAST TECHNIQUE: Multidetector CT imaging of the abdomen and pelvis was performed using the standard protocol following bolus administration of intravenous contrast. RADIATION DOSE REDUCTION: This exam was performed according to the departmental dose-optimization program which includes automated exposure control, adjustment of the mA and/or kV according to patient size and/or use of iterative reconstruction technique. CONTRAST:  100mL OMNIPAQUE  IOHEXOL  300 MG/ML  SOLN COMPARISON:  None Available. FINDINGS: Motion artifact limits evaluation of the lower abdomen. Lower chest: Bibasilar subsegmental atelectasis back Hepatobiliary: No focal liver abnormality is seen. Status post cholecystectomy. No biliary dilatation. Pancreas: Unremarkable. Spleen: Unremarkable. Adrenals/Urinary Tract: 1.6 cm left adrenal nodule, indeterminate. Bilateral renal cysts and additional subcentimeter renal hypodensities that are too small to characterize. No nephrolithiasis. No hydronephrosis. Stomach/Bowel: No evidence of bowel obstruction or inflammation. Appendix is unremarkable. Duodenal diverticulum. Vascular/Lymphatic: Normal caliber aorta. No lymphadenopathy by size criteria. Reproductive: Unremarkable. Other: No free air or free fluid. Musculoskeletal: Multilevel degenerative vertebral changes with grade 1 anterolisthesis of L4 on L5. IMPRESSION: 1.  No acute pathology in the abdomen or pelvis. 2. 1.6 cm indeterminate left adrenal nodule, probably benign. Consider follow-up 12 month adrenal CT for further evaluation. Electronically Signed   By: Michaeline Blanch M.D.   On: 12/31/2023 12:32   DG Chest Port 1 View Result Date: 12/31/2023 EXAM: 1 VIEW XRAY OF THE CHEST 12/31/2023 10:11:00 AM COMPARISON: AP radiograph of the chest dated 05/11/2017. CLINICAL HISTORY: CP. Per chart: Pt states he has had heartburn for the past 2 days and has taken tums with no relief; Hx HTN FINDINGS: LUNGS AND PLEURA: No focal  pulmonary opacity. No pulmonary edema. No pleural effusion. No pneumothorax. HEART AND MEDIASTINUM: No acute abnormality of the cardiac and mediastinal silhouettes. Moderate calcification of the aortic arch. BONES AND SOFT TISSUES: No acute osseous abnormality. IMPRESSION: 1. No acute findings. Electronically signed by: Evalene Coho MD 12/31/2023 10:16 AM EDT RP Workstation: DARYLENE Alm Schneider, DO  [facility]  If 7PM-7AM, please contact night-coverage [email or web address] Password [email or web address] 01/01/2024, 12:56 PM   LOS: 1 day

## 2024-01-01 NOTE — Progress Notes (Signed)
 Date and time results received: 01/01/24 2205   Test: Blood culture  Critical Value: E. coli no resistance 1/4 bottles   Name of Provider Notified: O. Adefeso

## 2024-01-02 DIAGNOSIS — R1110 Vomiting, unspecified: Secondary | ICD-10-CM | POA: Diagnosis not present

## 2024-01-02 DIAGNOSIS — B179 Acute viral hepatitis, unspecified: Secondary | ICD-10-CM | POA: Diagnosis not present

## 2024-01-02 DIAGNOSIS — E1165 Type 2 diabetes mellitus with hyperglycemia: Secondary | ICD-10-CM | POA: Diagnosis not present

## 2024-01-02 DIAGNOSIS — E7220 Disorder of urea cycle metabolism, unspecified: Secondary | ICD-10-CM

## 2024-01-02 DIAGNOSIS — A419 Sepsis, unspecified organism: Secondary | ICD-10-CM | POA: Diagnosis not present

## 2024-01-02 LAB — COMPREHENSIVE METABOLIC PANEL WITH GFR
ALT: 478 U/L — ABNORMAL HIGH (ref 0–44)
AST: 168 U/L — ABNORMAL HIGH (ref 15–41)
Albumin: 3.2 g/dL — ABNORMAL LOW (ref 3.5–5.0)
Alkaline Phosphatase: 106 U/L (ref 38–126)
Anion gap: 8 (ref 5–15)
BUN: 19 mg/dL (ref 8–23)
CO2: 22 mmol/L (ref 22–32)
Calcium: 9 mg/dL (ref 8.9–10.3)
Chloride: 108 mmol/L (ref 98–111)
Creatinine, Ser: 0.86 mg/dL (ref 0.61–1.24)
GFR, Estimated: >60 mL/min (ref >60)
Glucose, Bld: 233 mg/dL — ABNORMAL HIGH (ref 70–99)
Potassium: 3.6 mmol/L (ref 3.5–5.1)
Sodium: 138 mmol/L (ref 135–145)
Total Bilirubin: 2.2 mg/dL — ABNORMAL HIGH (ref 0.0–1.2)
Total Protein: 5.8 g/dL — ABNORMAL LOW (ref 6.5–8.1)

## 2024-01-02 LAB — CBC WITH DIFFERENTIAL/PLATELET
Abs Immature Granulocytes: 0.03 K/uL (ref 0.00–0.07)
Basophils Absolute: 0 K/uL (ref 0.0–0.1)
Basophils Relative: 1 %
Eosinophils Absolute: 0.1 K/uL (ref 0.0–0.5)
Eosinophils Relative: 2 %
HCT: 32.2 % — ABNORMAL LOW (ref 39.0–52.0)
Hemoglobin: 10.8 g/dL — ABNORMAL LOW (ref 13.0–17.0)
Immature Granulocytes: 1 %
Lymphocytes Relative: 10 %
Lymphs Abs: 0.6 K/uL — ABNORMAL LOW (ref 0.7–4.0)
MCH: 30.9 pg (ref 26.0–34.0)
MCHC: 33.5 g/dL (ref 30.0–36.0)
MCV: 92.3 fL (ref 80.0–100.0)
Monocytes Absolute: 0.3 K/uL (ref 0.1–1.0)
Monocytes Relative: 4 %
Neutro Abs: 4.9 K/uL (ref 1.7–7.7)
Neutrophils Relative %: 82 %
Platelets: 101 K/uL — ABNORMAL LOW (ref 150–400)
RBC: 3.49 MIL/uL — ABNORMAL LOW (ref 4.22–5.81)
RDW: 13.8 % (ref 11.5–15.5)
WBC: 5.9 K/uL (ref 4.0–10.5)
nRBC: 0 % (ref 0.0–0.2)

## 2024-01-02 LAB — CYTOMEGALOVIRUS DNA, QUANTITATIVE REAL-TIME PCR, PLASMA
CMV DNA Quant: NEGATIVE [IU]/mL
Log10 CMV Qn DNA Pl: UNDETERMINED {Log_IU}/mL

## 2024-01-02 LAB — EPSTEIN BARR VRS(EBV DNA BY PCR): EBV DNA QN by PCR: NEGATIVE [IU]/mL

## 2024-01-02 LAB — IRON AND TIBC
Iron: 32 ug/dL — ABNORMAL LOW (ref 45–182)
Saturation Ratios: 13 % — ABNORMAL LOW (ref 17.9–39.5)
TIBC: 245 ug/dL — ABNORMAL LOW (ref 250–450)
UIBC: 213 ug/dL

## 2024-01-02 LAB — FERRITIN: Ferritin: 169 ng/mL (ref 24–336)

## 2024-01-02 LAB — GLUCOSE, CAPILLARY
Glucose-Capillary: 195 mg/dL — ABNORMAL HIGH (ref 70–99)
Glucose-Capillary: 180 mg/dL — ABNORMAL HIGH (ref 70–99)
Glucose-Capillary: 230 mg/dL — ABNORMAL HIGH (ref 70–99)
Glucose-Capillary: 265 mg/dL — ABNORMAL HIGH (ref 70–99)

## 2024-01-02 LAB — HIV-1 RNA QUANT-NO REFLEX-BLD
HIV 1 RNA Quant: <20 {copies}/mL
LOG10 HIV-1 RNA: UNDETERMINED {Log_copies}/mL

## 2024-01-02 LAB — URINE CULTURE: Culture: NO GROWTH

## 2024-01-02 LAB — MAGNESIUM: Magnesium: 1.3 mg/dL — ABNORMAL LOW (ref 1.7–2.4)

## 2024-01-02 MED ORDER — MAGNESIUM SULFATE 2 GM/50ML IV SOLN
2.0000 g | Freq: Once | INTRAVENOUS | Status: AC
  Administered 2024-01-02: 2 g via INTRAVENOUS
  Filled 2024-01-02: qty 50

## 2024-01-02 MED ORDER — SODIUM CHLORIDE 0.9 % IV SOLN
2.0000 g | INTRAVENOUS | Status: DC
  Administered 2024-01-02 – 2024-01-03 (×2): 2 g via INTRAVENOUS
  Filled 2024-01-02 (×2): qty 20

## 2024-01-02 MED ORDER — POTASSIUM CHLORIDE CRYS ER 20 MEQ PO TBCR
40.0000 meq | EXTENDED_RELEASE_TABLET | Freq: Once | ORAL | Status: AC
  Administered 2024-01-02: 40 meq via ORAL
  Filled 2024-01-02: qty 2

## 2024-01-02 NOTE — Plan of Care (Signed)

## 2024-01-02 NOTE — Progress Notes (Signed)
 Gastroenterology Progress Note   Referring Provider: No ref. provider found Primary Care Physician:  Shona Norleen PEDLAR, MD Primary Gastroenterologist:  Dr. Cindie (previously unassigned)  Patient ID: Colton Johnson; [id number]; Aug 01, 1940    Subjective   Denies abdominal pain, jaundice, pruritus, melena, shortness of breath, chest pain.  States that he had been purchasing some over-the-counter supplements but he is unsure which ones although he had a picture of these that he stated he gave to another physician.  Per review of other notes it appears he was taking turmeric   Objective   Vital signs in last 24 hours Temp:  [97.9 F (36.6 C)-98.8 F (37.1 C)] 97.9 F (36.6 C) (09/08 1144) Pulse Rate:  [47-66] 52 (09/08 0400) Resp:  [14-21] 21 (09/08 0500) BP: (132-154)/(41-67) 141/67 (09/08 0400) SpO2:  [96 %-100 %] 98 % (09/08 0400) Last BM Date : 12/31/23  Physical Exam General:   Alert and oriented, pleasant Head:  Normocephalic and atraumatic. Eyes:  No icterus, sclera clear. Conjuctiva pink.  Mouth:  Without lesions, mucosa pink and moist.  Abdomen:  Bowel sounds present, soft, non-tender, non-distended. No HSM or hernias noted. No rebound or guarding. No masses appreciated  Extremities:  Without clubbing or edema. Neurologic:  Alert and  oriented x4;  grossly normal neurologically. Skin:  Warm and dry, intact without significant lesions.  Psych:  Alert and cooperative. Normal mood and affect.  Intake/Output from previous day: 09/07 0701 - 09/08 0700 In: 2020 [P.O.:320; I.V.:1500; IV Piggyback:200] Out: 600 [Urine:600] Intake/Output this shift: Total I/O In: 120 [P.O.:120] Out: -   Lab Results  Recent Labs    12/31/23 1037 01/01/24 0505 01/02/24 0437  WBC 7.3 9.6 5.9  HGB 13.4 10.9* 10.8*  HCT 40.4 31.9* 32.2*  PLT 155 139* 101*   BMET Recent Labs    12/31/23 1037 01/01/24 0505 01/02/24 0437  NA 137 134* 138  K 4.4 3.7 3.6  CL 104 104 108  CO2 22  23 22   GLUCOSE 258* 129* 233*  BUN 27* 24* 19  CREATININE 0.95 1.05 0.86  CALCIUM 10.0 8.6* 9.0   LFT Recent Labs    12/31/23 1037 01/01/24 0505 01/02/24 0437  PROT 7.3 5.4* 5.8*  ALBUMIN 4.2 3.0* 3.2*  AST 1,445* 436* 168*  ALT 912* 717* 478*  ALKPHOS 126 89 106  BILITOT 2.6* 3.9* 2.2*   PT/INR Recent Labs    12/31/23 1317  LABPROT 14.0  INR 1.0   Hepatitis Panel Recent Labs    12/31/23 1055  HEPBSAG NON REACTIVE  HCVAB NON REACTIVE  HEPAIGM NON REACTIVE  HEPBIGM NON REACTIVE    Studies/Results CT HEAD WO CONTRAST ( ) Result Date: 12/31/2023 CLINICAL DATA:  Epigastric abdominal pain and nausea. EXAM: CT HEAD WITHOUT CONTRAST TECHNIQUE: Contiguous axial images were obtained from the base of the skull through the vertex without intravenous contrast. RADIATION DOSE REDUCTION: This exam was performed according to the departmental dose-optimization program which includes automated exposure control, adjustment of the mA and/or kV according to patient size and/or use of iterative reconstruction technique. COMPARISON:  May 11, 2017 FINDINGS: Brain: There is generalized cerebral atrophy with widening of the extra-axial spaces and ventricular dilatation. There are areas of decreased attenuation within the white matter tracts of the supratentorial brain, consistent with microvascular disease changes. Vascular: Moderate severity bilateral cavernous carotid artery calcification is noted. Skull: Normal. Negative for fracture or focal lesion. Sinuses/Orbits: No acute finding. Other: None. IMPRESSION: 1. Generalized cerebral atrophy and microvascular disease  changes of the supratentorial brain. 2. No acute intracranial abnormality. Electronically Signed   By: Suzen Dials M.D.   On: 12/31/2023 15:00   US  Abdomen Limited RUQ (LIVER/GB) Result Date: 12/31/2023 CLINICAL DATA:  Nausea for several hours. EXAM: ULTRASOUND ABDOMEN LIMITED RIGHT UPPER QUADRANT COMPARISON:  CT scan of same  day. FINDINGS: Gallbladder: Status post cholecystectomy. Common bile duct: Diameter: 6 mm which is within normal limits. Liver: No focal lesion identified. Within normal limits in parenchymal echogenicity. Portal vein is patent on color Doppler imaging with normal direction of blood flow towards the liver. Other: None. IMPRESSION: Status post cholecystectomy. No other abnormality seen in the right upper quadrant of the abdomen. Electronically Signed   By: Lynwood Landy Raddle M.D.   On: 12/31/2023 14:28   CT ABDOMEN PELVIS W CONTRAST Result Date: 12/31/2023 CLINICAL DATA:  Epigastric abdominal pain EXAM: CT ABDOMEN AND PELVIS WITH CONTRAST TECHNIQUE: Multidetector CT imaging of the abdomen and pelvis was performed using the standard protocol following bolus administration of intravenous contrast. RADIATION DOSE REDUCTION: This exam was performed according to the departmental dose-optimization program which includes automated exposure control, adjustment of the mA and/or kV according to patient size and/or use of iterative reconstruction technique. CONTRAST:  OMNIPAQUE  IOHEXOL  300 MG/ML  SOLN COMPARISON:  None Available. FINDINGS: Motion artifact limits evaluation of the lower abdomen. Lower chest: Bibasilar subsegmental atelectasis back Hepatobiliary: No focal liver abnormality is seen. Status post cholecystectomy. No biliary dilatation. Pancreas: Unremarkable. Spleen: Unremarkable. Adrenals/Urinary Tract: 1.6 cm left adrenal nodule, indeterminate. Bilateral renal cysts and additional subcentimeter renal hypodensities that are too small to characterize. No nephrolithiasis. No hydronephrosis. Stomach/Bowel: No evidence of bowel obstruction or inflammation. Appendix is unremarkable. Duodenal diverticulum. Vascular/Lymphatic: Normal caliber aorta. No lymphadenopathy by size criteria. Reproductive: Unremarkable. Other: No free air or free fluid. Musculoskeletal: Multilevel degenerative vertebral changes with grade 1  anterolisthesis of L4 on L5. IMPRESSION: 1.  No acute pathology in the abdomen or pelvis. 2. 1.6 cm indeterminate left adrenal nodule, probably benign. Consider follow-up 12 month adrenal CT for further evaluation. Electronically Signed   By: Michaeline Blanch M.D.   On: 12/31/2023 12:32   DG Chest Port 1 View Result Date: 12/31/2023 EXAM: 1 VIEW XRAY OF THE CHEST 12/31/2023 10:11:00 AM COMPARISON: AP radiograph of the chest dated 05/11/2017. CLINICAL HISTORY: CP. Per chart: Pt states he has had heartburn for the past 2 days and has taken tums with no relief; Hx HTN FINDINGS: LUNGS AND PLEURA: No focal pulmonary opacity. No pulmonary edema. No pleural effusion. No pneumothorax. HEART AND MEDIASTINUM: No acute abnormality of the cardiac and mediastinal silhouettes. Moderate calcification of the aortic arch. BONES AND SOFT TISSUES: No acute osseous abnormality. IMPRESSION: 1. No acute findings. Electronically signed by: Evalene Coho MD 12/31/2023 10:16 AM EDT RP Workstation: [id number]    Assessment  83 y.o. male with a history of HTN, dyslipidemia, diabetes, vitamin D and B12 deficiency who presented to the ER with complaints of epigastric pain, nausea, and dry heaving.  GI consulted for further evaluation of elevated LFTs.  Transaminitis, elevated LFTs: - CT and ultrasound unremarkable for hepatic etiology, history of cholecystectomy. - Significantly elevated LFTs on admission with AST 1445, ALT 912, T. bili 2.6 - Lactate elevated at 3. - Blood cultures have been drawn, thus far 1 out of 4 blood cultures growing Enterobacterales and E. coli in aerobic bottle. - Evidence of hypotension on admission. - Elevation likely secondary to bacteremia and ischemia in the setting of  hypotension from sepsis. - Has been receiving IV antibiotics as appropriate. - Discussed he needs to discontinue turmeric supplementation for now - Thus far workup negative for viral hepatitis, UDS and ethanol negative.  ASA and  Tylenol  levels negative.  CMV and EBV negative.  HIV negative.  No elevation in ferritin. - Immunoglobulins, ANA, and ASMA pending (suspect them to be unremarkable) - Mental status has returned to baseline and no evidence of any confusion or encephalopathy today on exam - Typical slow rise in bilirubin, suspect this to decline over time   Plan / Recommendations  Continue supportive care and antibiotics for infection Continue to monitor LFTs until normalized.   GI will sign off at this time given improvement in LFTs.  If any new findings or worsening LFTs or if pending serologies are of concern feel free to reach back out to GI.    LOS: 2 days    01/02/2024, 3:50 PM   Charmaine Melia, MSN, FNP-BC, AGACNP-BC [facility]

## 2024-01-02 NOTE — Plan of Care (Signed)
  Problem: Education: Goal: Knowledge of General Education information will improve Description: Including pain rating scale, medication(s)/side effects and non-pharmacologic comfort measures Outcome: Progressing   Problem: Health Behavior/Discharge Planning: Goal: Ability to manage health-related needs will improve Outcome: Progressing   Problem: Clinical Measurements: Goal: Ability to maintain clinical measurements within normal limits will improve Outcome: Progressing Goal: Will remain free from infection Outcome: Progressing Goal: Diagnostic test results will improve Outcome: Progressing Goal: Respiratory complications will improve Outcome: Progressing Goal: Cardiovascular complication will be avoided Outcome: Progressing   Problem: Activity: Goal: Risk for activity intolerance will decrease Outcome: Progressing   Problem: Nutrition: Goal: Adequate nutrition will be maintained Outcome: Progressing   Problem: Coping: Goal: Level of anxiety will decrease Outcome: Progressing   Problem: Elimination: Goal: Will not experience complications related to bowel motility Outcome: Progressing Goal: Will not experience complications related to urinary retention Outcome: Progressing   Problem: Pain Managment: Goal: General experience of comfort will improve and/or be controlled Outcome: Progressing   Problem: Safety: Goal: Ability to remain free from injury will improve Outcome: Progressing   Problem: Education: Goal: Ability to describe self-care measures that may prevent or decrease complications (Diabetes Survival Skills Education) will improve Outcome: Progressing Goal: Individualized Educational Video(s) Outcome: Progressing   Problem: Coping: Goal: Ability to adjust to condition or change in health will improve Outcome: Progressing   Problem: Fluid Volume: Goal: Ability to maintain a balanced intake and output will improve Outcome: Progressing   Problem:  Health Behavior/Discharge Planning: Goal: Ability to identify and utilize available resources and services will improve Outcome: Progressing Goal: Ability to manage health-related needs will improve Outcome: Progressing   Problem: Metabolic: Goal: Ability to maintain appropriate glucose levels will improve Outcome: Progressing   Problem: Nutritional: Goal: Maintenance of adequate nutrition will improve Outcome: Progressing Goal: Progress toward achieving an optimal weight will improve Outcome: Progressing   Problem: Skin Integrity: Goal: Risk for impaired skin integrity will decrease Outcome: Progressing   Problem: Tissue Perfusion: Goal: Adequacy of tissue perfusion will improve Outcome: Progressing

## 2024-01-02 NOTE — Progress Notes (Addendum)
 PROGRESS NOTE  Colton Johnson FMW:[id number] DOB: 1940/09/07 DOA: 12/31/2023 PCP: Shona Norleen PEDLAR, MD  Brief History:  83 year old male with a history of diabetes mellitus type 2, hypertension, hyperlipidemia, BPH, lumbago, vitamin D deficiency presenting with nausea, epigastric pain, and dry heaving.  The patient is confused and encephalopathic at the time of my evaluation.  History is very limited from the patient.  History is supplemented from the patient's son.  The patient's son has been out of town for the last 1-1/2 months.  He has only been back in town for about a week and has noticed that his dad has had increasing confusion for the past week.  The patient's son states that the patient has had memory issues for the past year.  He is concerned about underlying cognitive impairment/dementia.  However, he does state that the patient has had significantly more confusion in the past week.  The patient has not voiced any particular complaints to him.  However son states that the patient does take a good number of over-the-counter supplements.  His son does not know what exact supplements the patient takes.  Son states that the patient had a previous history of heavy alcohol use, but has not drank in 30 years.  The patient previously smoked heavily but has quit 25 years ago.  He is not aware of any illicit drug use.  Patient's son states that the only complaint that the patient has voiced to him is poorly controlled GERD type symptoms. When asked simple questions, the patient denies any chest pain, shortness breath, abdominal pain, hematemesis, diarrhea. In the ED, the patient was febrile up to 102.2 and tachycardic.  He is hemodynamically stable.  Oxygen saturation 96% room air.  WBC 7.3, hemoglobin 13.4, plates 844.  Sodium 137, potassium 4.4, bicarbonate 22, serum creatinine 0.95.  AST 1445, ALT 912, alk phosphatase 126, total bilirubin 2.6.  Albumin 2.6. Ammonia 52.,  Salicylate level  undetectable, acetaminophen  undetectable.  UDS negative.  Alcohol level negative. Troponin 5>> 6 CT brain negative for any acute findings.  CT abdomen and pelvis was negative for any acute findings.  There was no focal liver abnormalities.  The patient is status post cholecystectomy without any bili ductal dilatation.  Right upper quadrant ultrasound was negative for any acute abnormality. INR 1.0   Assessment/Plan: Severe sepsis - Present on admission - Secondary to bacteremia - Lactic acid peaked 3.0 - Procalcitonin is 7.85 - Continue cefepime >>ceftriaxone  - Continue IV fluids - Follow blood cultures - UA negative for pyuria - Personally reviewed chest x-ray--no infiltrates or edema   E-coli bacteremia - Discontinue vancomycin  - Continue cefepime >>ceftriaxone   Transaminasemia - Acute viral hepatitis panel--negative - EBV DNA - CMV DNA -Check HIV RNA - Right upper quadrant ultrasound negative - 12/31/2023 CTAP negative for acute findings - INR 1.0 - Trend LFTs--trending down - Check COVID--neg - Check viral respiratory panel--neg - Suspect sepsis vs medication effect as the patient is also taking multiple supplements unknown to his son - pt was also on tumeric   Acute metabolic encephalopathy - Secondary to infectious process and hepatic process - urine culture neg - B12--1424 - TSH--0.618 - Ammonia 52>>23 - Patient has underlying cognitive impairment according to his son - CT brain--neg - UDS--neg -9/8 overall improved, back to baseline   Diabetes mellitus type 2 with hyperglycemia - Holding metformin - 9/6 Hemoglobin A1c 6.4 - NovoLog  sliding scale   Mixed hyperlipidemia - Holding  Zocor   Essential hypertension - Holding losartan temporarily - Holding atenolol temporarily - Holding triamterene hydrochlorothiazide  Hypomagnesemia -replete         Family Communication:   son 9/6   Consultants:  GI   Code Status:  FULL   DVT Prophylaxis:   [place]  Lovenox      Procedures: As Listed in Progress Note Above   Antibiotics: Ceftriaxone  9/8>> Cefepime  9/6>>9/7 Vanc 9/6>>9/7             Subjective: Patient denies fevers, chills, headache, chest pain, dyspnea, nausea, vomiting, diarrhea, abdominal pain, dysuria, hematuria, hematochezia, and melena.   Objective: Vitals:   01/02/24 0200 01/02/24 0300 01/02/24 0400 01/02/24 0500  BP: (!) 145/59  (!) 141/67   Pulse: (!) 57 (!) 47 (!) 52   Resp: 14 19 20  (!) 21  Temp:      TempSrc:      SpO2: 98% 98% 98%   Weight:      Height:        Intake/Output Summary (Last 24 hours) at 01/02/2024 0718 Last data filed at 01/01/2024 1800 Gross per 24 hour  Intake 2020 ml  Output 600 ml  Net 1420 ml   Weight change:  Exam:  General:  Pt is alert, follows commands appropriately, not in acute distress HEENT: No icterus, No thrush, No neck mass, [place]/AT Cardiovascular: RRR, S1/S2, no rubs, no gallops Respiratory: CTA bilaterally, no wheezing, no crackles, no rhonchi Abdomen: Soft/+BS, non tender, non distended, no guarding Extremities: No edema, No lymphangitis, No petechiae, No rashes, no synovitis   Data Reviewed: I have personally reviewed following labs and imaging studies Basic Metabolic Panel: Recent Labs  Lab 12/31/23 1037 01/01/24 0505 01/02/24 0437  NA 137 134* 138  K 4.4 3.7 3.6  CL 104 104 108  CO2 22 23 22   GLUCOSE 258* 129* 233*  BUN 27* 24* 19  CREATININE 0.95 1.05 0.86  CALCIUM 10.0 8.6* 9.0  MG  --   --  1.3*   Liver Function Tests: Recent Labs  Lab 12/31/23 1037 01/01/24 0505 01/02/24 0437  AST 1,445* 436* 168*  ALT 912* 717* 478*  ALKPHOS 126 89 106  BILITOT 2.6* 3.9* 2.2*  PROT 7.3 5.4* 5.8*  ALBUMIN 4.2 3.0* 3.2*   Recent Labs  Lab 12/31/23 1037  LIPASE 48   Recent Labs  Lab 12/31/23 1249 01/01/24 0505  AMMONIA 52* 23   Coagulation Profile: Recent Labs  Lab 12/31/23 1317  INR 1.0   CBC: Recent Labs  Lab 12/31/23 1037  01/01/24 0505 01/02/24 0437  WBC 7.3 9.6 5.9  NEUTROABS 6.8 8.3* 4.9  HGB 13.4 10.9* 10.8*  HCT 40.4 31.9* 32.2*  MCV 92.2 91.7 92.3  PLT 155 139* 101*   Cardiac Enzymes: No results for input(s): CKTOTAL, CKMB, CKMBINDEX, TROPONINI in the last 168 hours. BNP: Invalid input(s): POCBNP CBG: Recent Labs  Lab 12/31/23 2109 01/01/24 0710 01/01/24 1129 01/01/24 1612 01/01/24 2123  GLUCAP 194* 119* 154* 157* 168*   HbA1C: Recent Labs    12/31/23 1409  HGBA1C 6.4*   Urine analysis:    Component Value Date/Time   COLORURINE YELLOW 12/31/2023 0943   APPEARANCEUR CLEAR 12/31/2023 0943   LABSPEC 1.037 (H) 12/31/2023 0943   PHURINE 5.0 12/31/2023 0943   GLUCOSEU >=500 (A) 12/31/2023 0943   HGBUR SMALL (A) 12/31/2023 0943   BILIRUBINUR NEGATIVE 12/31/2023 0943   KETONESUR NEGATIVE 12/31/2023 0943   PROTEINUR 30 (A) 12/31/2023 0943   NITRITE NEGATIVE 12/31/2023  9056   LEUKOCYTESUR NEGATIVE 12/31/2023 0943   Sepsis Labs: @LABRCNTIP (procalcitonin:4,lacticidven:4) ) Recent Results (from the past 240 hours)  Urine Culture (for pregnant, neutropenic or urologic patients or patients with an indwelling urinary catheter)     Status: None (Preliminary result)   Collection Time: 12/31/23  1:07 PM   Specimen: Urine, Clean Catch  Result Value Ref Range Status   Specimen Description   Final    URINE, CLEAN CATCH Performed at [facility], [address] [place], [place] [address]    Special Requests   Final    NONE Performed at [facility], [address] [place], [place] [address]    Culture   Final    NO GROWTH Performed at [facility], [address] [place], [place] [address]    Report Status PENDING  Incomplete  Culture, blood (Routine X 2) w Reflex to ID Panel     Status: None (Preliminary result)   Collection Time: 12/31/23  1:15 PM   Specimen: Right Antecubital; Blood  Result Value Ref Range Status   Specimen Description   Final    RIGHT  ANTECUBITAL BLOOD Performed at [facility], [address], [place], [place] [address]    Special Requests   Final    Blood Culture adequate volume BOTTLES DRAWN AEROBIC AND ANAEROBIC Performed at [facility], [address] [place], [place] [address]    Culture  Setup Time   Final    GRAM NEGATIVE RODS AEROBIC BOTTLE ONLY Gram Stain Report Called to,Read Back By and Verified With: L. HILLTON ON 01/01/2024 @10 :31AM BY T. HAMER  CRITICAL RESULT CALLED TO, READ BACK BY AND VERIFIED WITH: K. [facility] RN 01/01/2024 @ 2203 BY AB Performed at [facility], [address], [place], [place] [address]    Culture GRAM NEGATIVE RODS  Final   Report Status PENDING  Incomplete  Blood Culture ID Panel (Reflexed)     Status: Abnormal   Collection Time: 12/31/23  1:15 PM  Result Value Ref Range Status   Enterococcus faecalis NOT DETECTED NOT DETECTED Final   Enterococcus Faecium NOT DETECTED NOT DETECTED Final   Listeria monocytogenes NOT DETECTED NOT DETECTED Final   Staphylococcus species NOT DETECTED NOT DETECTED Final   Staphylococcus aureus (BCID) NOT DETECTED NOT DETECTED Final   Staphylococcus epidermidis NOT DETECTED NOT DETECTED Final   Staphylococcus lugdunensis NOT DETECTED NOT DETECTED Final   Streptococcus species NOT DETECTED NOT DETECTED Final   Streptococcus agalactiae NOT DETECTED NOT DETECTED Final   Streptococcus pneumoniae NOT DETECTED NOT DETECTED Final   Streptococcus pyogenes NOT DETECTED NOT DETECTED Final   A.calcoaceticus-baumannii NOT DETECTED NOT DETECTED Final   Bacteroides fragilis NOT DETECTED NOT DETECTED Final   Enterobacterales DETECTED (A) NOT DETECTED Final    Comment: Enterobacterales represent a large order of gram negative bacteria, not a single organism. CRITICAL RESULT CALLED TO, READ BACK BY AND VERIFIED WITH: K. [facility] RN 01/01/2024 @ 2203 BY AB    Enterobacter cloacae complex NOT DETECTED NOT DETECTED Final   Escherichia coli DETECTED  (A) NOT DETECTED Final    Comment: CRITICAL RESULT CALLED TO, READ BACK BY AND VERIFIED WITH: K. [facility] RN 01/01/2024 @ 2203 BY AB    Klebsiella aerogenes NOT DETECTED NOT DETECTED Final   Klebsiella oxytoca NOT DETECTED NOT DETECTED Final   Klebsiella pneumoniae NOT DETECTED NOT DETECTED Final   Proteus species NOT DETECTED NOT DETECTED Final   Salmonella species NOT DETECTED  NOT DETECTED Final   Serratia marcescens NOT DETECTED NOT DETECTED Final   Haemophilus influenzae NOT DETECTED NOT DETECTED Final   Neisseria meningitidis NOT DETECTED NOT DETECTED Final   Pseudomonas aeruginosa NOT DETECTED NOT DETECTED Final   Stenotrophomonas maltophilia NOT DETECTED NOT DETECTED Final   Candida albicans NOT DETECTED NOT DETECTED Final   Candida auris NOT DETECTED NOT DETECTED Final   Candida glabrata NOT DETECTED NOT DETECTED Final   Candida krusei NOT DETECTED NOT DETECTED Final   Candida parapsilosis NOT DETECTED NOT DETECTED Final   Candida tropicalis NOT DETECTED NOT DETECTED Final   Cryptococcus neoformans/gattii NOT DETECTED NOT DETECTED Final   CTX-M ESBL NOT DETECTED NOT DETECTED Final   Carbapenem resistance IMP NOT DETECTED NOT DETECTED Final   Carbapenem resistance KPC NOT DETECTED NOT DETECTED Final   Carbapenem resistance NDM NOT DETECTED NOT DETECTED Final   Carbapenem resist OXA 48 LIKE NOT DETECTED NOT DETECTED Final   Carbapenem resistance VIM NOT DETECTED NOT DETECTED Final    Comment: Performed at [facility], [address], [place], [place] [address]  MRSA Next Gen by PCR, Nasal     Status: None   Collection Time: 12/31/23  2:32 PM   Specimen: Nasal Mucosa; Nasal Swab  Result Value Ref Range Status   MRSA by PCR Next Gen NOT DETECTED NOT DETECTED Final    Comment: (NOTE) The GeneXpert MRSA Assay (FDA approved for NASAL specimens only), is one component of a comprehensive MRSA colonization surveillance program. It is not intended to diagnose MRSA infection nor  to guide or monitor treatment for MRSA infections. Test performance is not FDA approved in patients less than 36 years old. Performed at [facility], [address], [place], [place] [address]   Resp panel by RT-PCR (RSV, Flu A&B, Covid) Anterior Nasal Swab     Status: None   Collection Time: 12/31/23  2:33 PM   Specimen: Anterior Nasal Swab  Result Value Ref Range Status   SARS Coronavirus 2 by RT PCR NEGATIVE NEGATIVE Final    Comment: (NOTE) SARS-CoV-2 target nucleic acids are NOT DETECTED.  The SARS-CoV-2 RNA is generally detectable in upper respiratory specimens during the acute phase of infection. The lowest concentration of SARS-CoV-2 viral copies this assay can detect is 138 copies/mL. A negative result does not preclude SARS-Cov-2 infection and should not be used as the sole basis for treatment or other patient management decisions. A negative result may occur with  improper specimen collection/handling, submission of specimen other than nasopharyngeal swab, presence of viral mutation(s) within the areas targeted by this assay, and inadequate number of viral copies(<138 copies/mL). A negative result must be combined with clinical observations, patient history, and epidemiological information. The expected result is Negative.  Fact Sheet for Patients:  BloggerCourse.com  Fact Sheet for Healthcare Providers:  SeriousBroker.it  This test is no t yet approved or cleared by the [place]  FDA and  has been authorized for detection and/or diagnosis of SARS-CoV-2 by FDA under an Emergency Use Authorization (EUA). This EUA will remain  in effect (meaning this test can be used) for the duration of the COVID-19 declaration under Section 564(b)(1) of the Act, 21 U.S.C.section 360bbb-3(b)(1), unless the authorization is terminated  or revoked sooner.       Influenza A by PCR NEGATIVE NEGATIVE Final   Influenza B by PCR  NEGATIVE NEGATIVE Final    Comment: (NOTE) The Xpert Xpress SARS-CoV-2/FLU/RSV plus assay is intended as an aid in  the diagnosis of influenza from Nasopharyngeal swab specimens and should not be used as a sole basis for treatment. Nasal washings and aspirates are unacceptable for Xpert Xpress SARS-CoV-2/FLU/RSV testing.  Fact Sheet for Patients: BloggerCourse.com  Fact Sheet for Healthcare Providers: SeriousBroker.it  This test is not yet approved or cleared by the [place]  FDA and has been authorized for detection and/or diagnosis of SARS-CoV-2 by FDA under an Emergency Use Authorization (EUA). This EUA will remain in effect (meaning this test can be used) for the duration of the COVID-19 declaration under Section 564(b)(1) of the Act, 21 U.S.C. section 360bbb-3(b)(1), unless the authorization is terminated or revoked.     Resp Syncytial Virus by PCR NEGATIVE NEGATIVE Final    Comment: (NOTE) Fact Sheet for Patients: BloggerCourse.com  Fact Sheet for Healthcare Providers: SeriousBroker.it  This test is not yet approved or cleared by the [place]  FDA and has been authorized for detection and/or diagnosis of SARS-CoV-2 by FDA under an Emergency Use Authorization (EUA). This EUA will remain in effect (meaning this test can be used) for the duration of the COVID-19 declaration under Section 564(b)(1) of the Act, 21 U.S.C. section 360bbb-3(b)(1), unless the authorization is terminated or revoked.  Performed at [facility], [address], [place], [place] [address]   Respiratory (~20 pathogens) panel by PCR     Status: None   Collection Time: 12/31/23  3:04 PM   Specimen: Nasopharyngeal Swab; Respiratory  Result Value Ref Range Status   Adenovirus NOT DETECTED NOT DETECTED Final   Coronavirus 229E NOT DETECTED NOT DETECTED Final    Comment: (NOTE) The Coronavirus on  the Respiratory Panel, DOES NOT test for the novel  Coronavirus (2019 nCoV)    Coronavirus HKU1 NOT DETECTED NOT DETECTED Final   Coronavirus NL63 NOT DETECTED NOT DETECTED Final   Coronavirus OC43 NOT DETECTED NOT DETECTED Final   Metapneumovirus NOT DETECTED NOT DETECTED Final   Rhinovirus / Enterovirus NOT DETECTED NOT DETECTED Final   Influenza A NOT DETECTED NOT DETECTED Final   Influenza B NOT DETECTED NOT DETECTED Final   Parainfluenza Virus 1 NOT DETECTED NOT DETECTED Final   Parainfluenza Virus 2 NOT DETECTED NOT DETECTED Final   Parainfluenza Virus 3 NOT DETECTED NOT DETECTED Final   Parainfluenza Virus 4 NOT DETECTED NOT DETECTED Final   Respiratory Syncytial Virus NOT DETECTED NOT DETECTED Final   Bordetella pertussis NOT DETECTED NOT DETECTED Final   Bordetella Parapertussis NOT DETECTED NOT DETECTED Final   Chlamydophila pneumoniae NOT DETECTED NOT DETECTED Final   Mycoplasma pneumoniae NOT DETECTED NOT DETECTED Final    Comment: Performed at [facility], [address] [place], [place] [address]  Culture, blood (Routine X 2) w Reflex to ID Panel     Status: None (Preliminary result)   Collection Time: 12/31/23  3:37 PM   Specimen: BLOOD  Result Value Ref Range Status   Specimen Description BLOOD RIGHT ANTECUBITAL  Final   Special Requests   Final    BOTTLES DRAWN AEROBIC AND ANAEROBIC Blood Culture adequate volume   Culture   Final    NO GROWTH 2 DAYS Performed at [facility], [address], [place], [place] [address]    Report Status PENDING  Incomplete     Scheduled Meds:  Chlorhexidine  Gluconate Cloth  6 each Topical Daily   enoxaparin  (LOVENOX ) injection  40 mg Subcutaneous Q24H   insulin  aspart  0-9 Units Subcutaneous TID WC   pantoprazole  (PROTONIX ) IV  40 mg  Intravenous Q24H   Continuous Infusions:  ceFEPime  (MAXIPIME ) IV 2 g (01/02/24 0126)    Procedures/Studies: CT HEAD WO CONTRAST ( ) Result Date: 12/31/2023 CLINICAL DATA:  Epigastric  abdominal pain and nausea. EXAM: CT HEAD WITHOUT CONTRAST TECHNIQUE: Contiguous axial images were obtained from the base of the skull through the vertex without intravenous contrast. RADIATION DOSE REDUCTION: This exam was performed according to the departmental dose-optimization program which includes automated exposure control, adjustment of the mA and/or kV according to patient size and/or use of iterative reconstruction technique. COMPARISON:  May 11, 2017 FINDINGS: Brain: There is generalized cerebral atrophy with widening of the extra-axial spaces and ventricular dilatation. There are areas of decreased attenuation within the white matter tracts of the supratentorial brain, consistent with microvascular disease changes. Vascular: Moderate severity bilateral cavernous carotid artery calcification is noted. Skull: Normal. Negative for fracture or focal lesion. Sinuses/Orbits: No acute finding. Other: None. IMPRESSION: 1. Generalized cerebral atrophy and microvascular disease changes of the supratentorial brain. 2. No acute intracranial abnormality. Electronically Signed   By: Suzen Dials M.D.   On: 12/31/2023 15:00   US  Abdomen Limited RUQ (LIVER/GB) Result Date: 12/31/2023 CLINICAL DATA:  Nausea for several hours. EXAM: ULTRASOUND ABDOMEN LIMITED RIGHT UPPER QUADRANT COMPARISON:  CT scan of same day. FINDINGS: Gallbladder: Status post cholecystectomy. Common bile duct: Diameter: 6 mm which is within normal limits. Liver: No focal lesion identified. Within normal limits in parenchymal echogenicity. Portal vein is patent on color Doppler imaging with normal direction of blood flow towards the liver. Other: None. IMPRESSION: Status post cholecystectomy. No other abnormality seen in the right upper quadrant of the abdomen. Electronically Signed   By: Lynwood Landy Raddle M.D.   On: 12/31/2023 14:28   CT ABDOMEN PELVIS W CONTRAST Result Date: 12/31/2023 CLINICAL DATA:  Epigastric abdominal pain EXAM: CT  ABDOMEN AND PELVIS WITH CONTRAST TECHNIQUE: Multidetector CT imaging of the abdomen and pelvis was performed using the standard protocol following bolus administration of intravenous contrast. RADIATION DOSE REDUCTION: This exam was performed according to the departmental dose-optimization program which includes automated exposure control, adjustment of the mA and/or kV according to patient size and/or use of iterative reconstruction technique. CONTRAST:  OMNIPAQUE  IOHEXOL  300 MG/ML  SOLN COMPARISON:  None Available. FINDINGS: Motion artifact limits evaluation of the lower abdomen. Lower chest: Bibasilar subsegmental atelectasis back Hepatobiliary: No focal liver abnormality is seen. Status post cholecystectomy. No biliary dilatation. Pancreas: Unremarkable. Spleen: Unremarkable. Adrenals/Urinary Tract: 1.6 cm left adrenal nodule, indeterminate. Bilateral renal cysts and additional subcentimeter renal hypodensities that are too small to characterize. No nephrolithiasis. No hydronephrosis. Stomach/Bowel: No evidence of bowel obstruction or inflammation. Appendix is unremarkable. Duodenal diverticulum. Vascular/Lymphatic: Normal caliber aorta. No lymphadenopathy by size criteria. Reproductive: Unremarkable. Other: No free air or free fluid. Musculoskeletal: Multilevel degenerative vertebral changes with grade 1 anterolisthesis of L4 on L5. IMPRESSION: 1.  No acute pathology in the abdomen or pelvis. 2. 1.6 cm indeterminate left adrenal nodule, probably benign. Consider follow-up 12 month adrenal CT for further evaluation. Electronically Signed   By: Michaeline Blanch M.D.   On: 12/31/2023 12:32   DG Chest Port 1 View Result Date: 12/31/2023 EXAM: 1 VIEW XRAY OF THE CHEST 12/31/2023 10:11:00 AM COMPARISON: AP radiograph of the chest dated 05/11/2017. CLINICAL HISTORY: CP. Per chart: Pt states he has had heartburn for the past 2 days and has taken tums with no relief; Hx HTN FINDINGS: LUNGS AND PLEURA: No focal  pulmonary opacity. No pulmonary edema. No  pleural effusion. No pneumothorax. HEART AND MEDIASTINUM: No acute abnormality of the cardiac and mediastinal silhouettes. Moderate calcification of the aortic arch. BONES AND SOFT TISSUES: No acute osseous abnormality. IMPRESSION: 1. No acute findings. Electronically signed by: Evalene Coho MD 12/31/2023 10:16 AM EDT RP Workstation: DARYLENE Alm Schneider, DO  [facility]  If 7PM-7AM, please contact night-coverage [email or web address] Password [email or web address] 01/02/2024, 7:18 AM   LOS: 2 days

## 2024-01-03 ENCOUNTER — Telehealth: Payer: Self-pay | Admitting: Gastroenterology

## 2024-01-03 DIAGNOSIS — G9341 Metabolic encephalopathy: Secondary | ICD-10-CM | POA: Diagnosis not present

## 2024-01-03 DIAGNOSIS — E1165 Type 2 diabetes mellitus with hyperglycemia: Secondary | ICD-10-CM | POA: Diagnosis not present

## 2024-01-03 DIAGNOSIS — R748 Abnormal levels of other serum enzymes: Secondary | ICD-10-CM | POA: Diagnosis not present

## 2024-01-03 DIAGNOSIS — R7881 Bacteremia: Secondary | ICD-10-CM

## 2024-01-03 DIAGNOSIS — R1110 Vomiting, unspecified: Secondary | ICD-10-CM | POA: Diagnosis not present

## 2024-01-03 DIAGNOSIS — B9620 Unspecified Escherichia coli [E. coli] as the cause of diseases classified elsewhere: Secondary | ICD-10-CM

## 2024-01-03 LAB — CBC
HCT: 33.8 % — ABNORMAL LOW (ref 39.0–52.0)
Hemoglobin: 11.4 g/dL — ABNORMAL LOW (ref 13.0–17.0)
MCH: 30.6 pg (ref 26.0–34.0)
MCHC: 33.7 g/dL (ref 30.0–36.0)
MCV: 90.9 fL (ref 80.0–100.0)
Platelets: 142 K/uL — ABNORMAL LOW (ref 150–400)
RBC: 3.72 MIL/uL — ABNORMAL LOW (ref 4.22–5.81)
RDW: 13.8 % (ref 11.5–15.5)
WBC: 5.3 K/uL (ref 4.0–10.5)
nRBC: 0 % (ref 0.0–0.2)

## 2024-01-03 LAB — COMPREHENSIVE METABOLIC PANEL WITH GFR
ALT: 435 U/L — ABNORMAL HIGH (ref 0–44)
AST: 154 U/L — ABNORMAL HIGH (ref 15–41)
Albumin: 3.7 g/dL (ref 3.5–5.0)
Alkaline Phosphatase: 152 U/L — ABNORMAL HIGH (ref 38–126)
Anion gap: 8 (ref 5–15)
BUN: 12 mg/dL (ref 8–23)
CO2: 23 mmol/L (ref 22–32)
Calcium: 9.5 mg/dL (ref 8.9–10.3)
Chloride: 107 mmol/L (ref 98–111)
Creatinine, Ser: 0.66 mg/dL (ref 0.61–1.24)
GFR, Estimated: >60 mL/min (ref >60)
Glucose, Bld: 159 mg/dL — ABNORMAL HIGH (ref 70–99)
Potassium: 3.5 mmol/L (ref 3.5–5.1)
Sodium: 138 mmol/L (ref 135–145)
Total Bilirubin: 1.6 mg/dL — ABNORMAL HIGH (ref 0.0–1.2)
Total Protein: 6.7 g/dL (ref 6.5–8.1)

## 2024-01-03 LAB — ANA W/REFLEX IF POSITIVE: Anti Nuclear Antibody (ANA): NEGATIVE

## 2024-01-03 LAB — MAGNESIUM: Magnesium: 1.6 mg/dL — ABNORMAL LOW (ref 1.7–2.4)

## 2024-01-03 LAB — GLUCOSE, CAPILLARY
Glucose-Capillary: 153 mg/dL — ABNORMAL HIGH (ref 70–99)
Glucose-Capillary: 178 mg/dL — ABNORMAL HIGH (ref 70–99)

## 2024-01-03 MED ORDER — MAGNESIUM OXIDE -MG SUPPLEMENT 400 (240 MG) MG PO TABS
400.0000 mg | ORAL_TABLET | Freq: Once | ORAL | Status: AC
  Administered 2024-01-03: 400 mg via ORAL
  Filled 2024-01-03: qty 1

## 2024-01-03 MED ORDER — MAGNESIUM OXIDE -MG SUPPLEMENT 400 (240 MG) MG PO TABS: 400.0000 mg | ORAL_TABLET | Freq: Every day | ORAL | 0 refills | Status: AC

## 2024-01-03 MED ORDER — POTASSIUM CHLORIDE CRYS ER 20 MEQ PO TBCR
20.0000 meq | EXTENDED_RELEASE_TABLET | Freq: Once | ORAL | Status: AC
  Administered 2024-01-03: 20 meq via ORAL
  Filled 2024-01-03: qty 1

## 2024-01-03 MED ORDER — CEFDINIR 300 MG PO CAPS: 300.0000 mg | ORAL_CAPSULE | Freq: Two times a day (BID) | ORAL | 0 refills | Status: DC

## 2024-01-03 MED ORDER — MAGNESIUM OXIDE -MG SUPPLEMENT 400 (240 MG) MG PO TABS: 400.0000 mg | ORAL_TABLET | Freq: Every day | ORAL | Status: DC

## 2024-01-03 MED ORDER — MAGNESIUM SULFATE 2 GM/50ML IV SOLN
2.0000 g | Freq: Once | INTRAVENOUS | Status: AC
  Administered 2024-01-03: 2 g via INTRAVENOUS
  Filled 2024-01-03: qty 50

## 2024-01-03 NOTE — Discharge Summary (Signed)
 Physician Discharge Summary   Patient: Colton Johnson MRN: [id number] DOB: [birth year]  Admit date:     12/31/2023  Discharge date: 01/03/24  Discharge Physician: Alm Gredmarie Delange   PCP: Shona Norleen PEDLAR, MD   Recommendations at discharge:   Please follow up with primary care provider within 1-2 weeks  Please repeat BMP, LFTs and CBC in one week     Hospital Course: 83 year old male with a history of diabetes mellitus type 2, hypertension, hyperlipidemia, BPH, lumbago, vitamin D deficiency presenting with nausea, epigastric pain, and dry heaving.  The patient is confused and encephalopathic at the time of my evaluation.  History is very limited from the patient.  History is supplemented from the patient's son.  The patient's son has been out of town for the last 1-1/2 months.  He has only been back in town for about a week and has noticed that his dad has had increasing confusion for the past week.  The patient's son states that the patient has had memory issues for the past year.  He is concerned about underlying cognitive impairment/dementia.  However, he does state that the patient has had significantly more confusion in the past week.  The patient has not voiced any particular complaints to him.  However son states that the patient does take a good number of over-the-counter supplements.  His son does not know what exact supplements the patient takes.  Son states that the patient had a previous history of heavy alcohol use, but has not drank in 30 years.  The patient previously smoked heavily but has quit 25 years ago.  He is not aware of any illicit drug use.  Patient's son states that the only complaint that the patient has voiced to him is poorly controlled GERD type symptoms. When asked simple questions, the patient denies any chest pain, shortness breath, abdominal pain, hematemesis, diarrhea. In the ED, the patient was febrile up to 102.2 and tachycardic.  He is hemodynamically stable.  Oxygen  saturation 96% room air.  WBC 7.3, hemoglobin 13.4, plates 844.  Sodium 137, potassium 4.4, bicarbonate 22, serum creatinine 0.95.  AST 1445, ALT 912, alk phosphatase 126, total bilirubin 2.6.  Albumin 2.6. Ammonia 52.,  Salicylate level undetectable, acetaminophen  undetectable.  UDS negative.  Alcohol level negative. Troponin 5>> 6 CT brain negative for any acute findings.  CT abdomen and pelvis was negative for any acute findings.  There was no focal liver abnormalities.  The patient is status post cholecystectomy without any bili ductal dilatation.  Right upper quadrant ultrasound was negative for any acute abnormality. INR 1.0  Assessment and Plan: Severe sepsis - Present on admission - Secondary to bacteremia - Lactic acid peaked 3.0 - Procalcitonin -- 7.85 - Continue cefepime >>ceftriaxone  - Continue IV fluids - Follow blood cultures>>E coli - UA negative for pyuria - Personally reviewed chest x-ray--no infiltrates or edema - sepsis physiology resolved   E-coli bacteremia - Discontinue vancomycin  - Continue cefepime >>ceftriaxone  - d/c home with 6 more days cefdinir   Transaminasemia - Acute viral hepatitis panel--negative - EBV DNA - CMV DNA -Check HIV RNA - Right upper quadrant ultrasound negative - 12/31/2023 CTAP negative for acute findings - INR 1.0 - Trend LFTs--trending down - Check COVID--neg - Check viral respiratory panel--neg - Suspect sepsis vs medication effect as the patient is also taking multiple supplements unknown to his son - pt was also on tumeric - overall trending down; no abd pain; tolerating diet - GI has set up follow up  for him in the office at the time of dc   Acute metabolic encephalopathy - Secondary to infectious process and hepatic process - urine culture neg - B12--1424 - TSH--0.618 - Ammonia 52>>23 - Patient has underlying cognitive impairment according to his son - CT brain--neg - UDS--neg -9/8 overall improved, back to baseline    Diabetes mellitus type 2 with hyperglycemia - Holding metformin>>resume after dc - 9/6 Hemoglobin A1c 6.4 - NovoLog  sliding scale   Mixed hyperlipidemia - Holding Zocor>>do not restart until cleared by GI   Essential hypertension - Holding losartan temporarily - Holding atenolol temporarily - Holding triamterene hydrochlorothiazide - will not restart atenolol due to bradycardia   Hypomagnesemia -repleted -start daily mag ox        Consultants: GI Procedures performed: none  Disposition: Home Diet recommendation:  Cardiac and Carb modified diet DISCHARGE MEDICATION: Allergies as of 01/03/2024       Reactions   Aleve [naproxen Sodium] Itching   Other Swelling   Seafood  Made him feel bad        Medication List     STOP taking these medications    atenolol 50 MG tablet Commonly known as: TENORMIN   simvastatin 40 MG tablet Commonly known as: ZOCOR       TAKE these medications    cefdinir  300 MG capsule Commonly known as: OMNICEF  Take 1 capsule (300 mg total) by mouth 2 (two) times daily.   vitamin B-12 100 MCG tablet Commonly known as: CYANOCOBALAMIN  Take 100 mcg by mouth daily.   cyanocobalamin  1000 MCG/ML injection Commonly known as: VITAMIN B12 Inject 1,000 mcg into the skin every 30 (thirty) days.   losartan 25 MG tablet Commonly known as: COZAAR Take 25 mg by mouth daily. for high blood pressure   magnesium  oxide 400 (240 Mg) MG tablet Commonly known as: MAG-OX Take 1 tablet (400 mg total) by mouth daily. Start taking on: January 04, 2024   metFORMIN 1000 MG tablet Commonly known as: GLUCOPHAGE Take 1,000 mg by mouth 2 (two) times daily.   OMEGA FATTY ACIDS-VITAMINS PO Take 1 capsule by mouth daily. Omega XL - omega 3 fatty acids, DHA, EPA, vit E, olive oil, green lipped mussel oil extract   Omega-3 1000 MG Caps Take 1 capsule by mouth daily.   omeprazole 20 MG capsule Commonly known as: PRILOSEC Take 20 mg by mouth as  needed (acid reflux, heartburn).   polyethylene glycol powder 17 GM/SCOOP powder Commonly known as: MiraLax  Take 3-5 scoops once to twice daily as needed for current constipation, may decrease to one scoop once daily once symptoms resolve What changed:  how much to take how to take this when to take this reasons to take this   tamsulosin 0.4 MG Caps capsule Commonly known as: FLOMAX Take 0.4 mg by mouth daily.   traMADol 50 MG tablet Commonly known as: ULTRAM Take 50 mg by mouth every 8 (eight) hours as needed for severe pain (pain score 7-10).   triamterene-hydrochlorothiazide 37.5-25 MG capsule Commonly known as: DYAZIDE Take 1 capsule by mouth daily.        Discharge Exam: Filed Weights   12/31/23 0921 12/31/23 1457  Weight: 77.1 kg 71.7 kg   HEENT:  [place]/AT, No thrush, no icterus CV:  RRR, no rub, no S3, no S4 Lung:  CTA, no wheeze, no rhonchi Abd:  soft/+BS, NT Ext:  No edema, no lymphangitis, no synovitis, no rash   Condition at discharge: stable  The results of  significant diagnostics from this hospitalization (including imaging, microbiology, ancillary and laboratory) are listed below for reference.   Imaging Studies: CT HEAD WO CONTRAST ( ) Result Date: 12/31/2023 CLINICAL DATA:  Epigastric abdominal pain and nausea. EXAM: CT HEAD WITHOUT CONTRAST TECHNIQUE: Contiguous axial images were obtained from the base of the skull through the vertex without intravenous contrast. RADIATION DOSE REDUCTION: This exam was performed according to the departmental dose-optimization program which includes automated exposure control, adjustment of the mA and/or kV according to patient size and/or use of iterative reconstruction technique. COMPARISON:  May 11, 2017 FINDINGS: Brain: There is generalized cerebral atrophy with widening of the extra-axial spaces and ventricular dilatation. There are areas of decreased attenuation within the white matter tracts of the supratentorial  brain, consistent with microvascular disease changes. Vascular: Moderate severity bilateral cavernous carotid artery calcification is noted. Skull: Normal. Negative for fracture or focal lesion. Sinuses/Orbits: No acute finding. Other: None. IMPRESSION: 1. Generalized cerebral atrophy and microvascular disease changes of the supratentorial brain. 2. No acute intracranial abnormality. Electronically Signed   By: Suzen Dials M.D.   On: 12/31/2023 15:00   US  Abdomen Limited RUQ (LIVER/GB) Result Date: 12/31/2023 CLINICAL DATA:  Nausea for several hours. EXAM: ULTRASOUND ABDOMEN LIMITED RIGHT UPPER QUADRANT COMPARISON:  CT scan of same day. FINDINGS: Gallbladder: Status post cholecystectomy. Common bile duct: Diameter: 6 mm which is within normal limits. Liver: No focal lesion identified. Within normal limits in parenchymal echogenicity. Portal vein is patent on color Doppler imaging with normal direction of blood flow towards the liver. Other: None. IMPRESSION: Status post cholecystectomy. No other abnormality seen in the right upper quadrant of the abdomen. Electronically Signed   By: Lynwood Landy Raddle M.D.   On: 12/31/2023 14:28   CT ABDOMEN PELVIS W CONTRAST Result Date: 12/31/2023 CLINICAL DATA:  Epigastric abdominal pain EXAM: CT ABDOMEN AND PELVIS WITH CONTRAST TECHNIQUE: Multidetector CT imaging of the abdomen and pelvis was performed using the standard protocol following bolus administration of intravenous contrast. RADIATION DOSE REDUCTION: This exam was performed according to the departmental dose-optimization program which includes automated exposure control, adjustment of the mA and/or kV according to patient size and/or use of iterative reconstruction technique. CONTRAST:  OMNIPAQUE  IOHEXOL  300 MG/ML  SOLN COMPARISON:  None Available. FINDINGS: Motion artifact limits evaluation of the lower abdomen. Lower chest: Bibasilar subsegmental atelectasis back Hepatobiliary: No focal liver abnormality  is seen. Status post cholecystectomy. No biliary dilatation. Pancreas: Unremarkable. Spleen: Unremarkable. Adrenals/Urinary Tract: 1.6 cm left adrenal nodule, indeterminate. Bilateral renal cysts and additional subcentimeter renal hypodensities that are too small to characterize. No nephrolithiasis. No hydronephrosis. Stomach/Bowel: No evidence of bowel obstruction or inflammation. Appendix is unremarkable. Duodenal diverticulum. Vascular/Lymphatic: Normal caliber aorta. No lymphadenopathy by size criteria. Reproductive: Unremarkable. Other: No free air or free fluid. Musculoskeletal: Multilevel degenerative vertebral changes with grade 1 anterolisthesis of L4 on L5. IMPRESSION: 1.  No acute pathology in the abdomen or pelvis. 2. 1.6 cm indeterminate left adrenal nodule, probably benign. Consider follow-up 12 month adrenal CT for further evaluation. Electronically Signed   By: Michaeline Blanch M.D.   On: 12/31/2023 12:32   DG Chest Port 1 View Result Date: 12/31/2023 EXAM: 1 VIEW XRAY OF THE CHEST 12/31/2023 10:11:00 AM COMPARISON: AP radiograph of the chest dated 05/11/2017. CLINICAL HISTORY: CP. Per chart: Pt states he has had heartburn for the past 2 days and has taken tums with no relief; Hx HTN FINDINGS: LUNGS AND PLEURA: No focal pulmonary opacity. No pulmonary edema.  No pleural effusion. No pneumothorax. HEART AND MEDIASTINUM: No acute abnormality of the cardiac and mediastinal silhouettes. Moderate calcification of the aortic arch. BONES AND SOFT TISSUES: No acute osseous abnormality. IMPRESSION: 1. No acute findings. Electronically signed by: Evalene Coho MD 12/31/2023 10:16 AM EDT RP Workstation: [id number]    Microbiology: Results for orders placed or performed during the hospital encounter of 12/31/23  Urine Culture (for pregnant, neutropenic or urologic patients or patients with an indwelling urinary catheter)     Status: None   Collection Time: 12/31/23  1:07 PM   Specimen: Urine, Clean Catch   Result Value Ref Range Status   Specimen Description   Final    URINE, CLEAN CATCH Performed at [facility], [address] [place], [place] [address]    Special Requests   Final    NONE Performed at [facility], [address] [place], [place] [address]    Culture   Final    NO GROWTH Performed at [facility], [address] [place], [place] [address]    Report Status 01/02/2024 FINAL  Final  Culture, blood (Routine X 2) w Reflex to ID Panel     Status: Abnormal (Preliminary result)   Collection Time: 12/31/23  1:15 PM   Specimen: Right Antecubital; Blood  Result Value Ref Range Status   Specimen Description RIGHT ANTECUBITAL BLOOD  Final   Special Requests   Final    Blood Culture adequate volume BOTTLES DRAWN AEROBIC AND ANAEROBIC   Culture  Setup Time   Final    GRAM NEGATIVE RODS AEROBIC BOTTLE ONLY Gram Stain Report Called to,Read Back By and Verified With: L. HILLTON ON 01/01/2024 @10 :31AM BY T. HAMER  CRITICAL RESULT CALLED TO, READ BACK BY AND VERIFIED WITH: K. [facility] RN 01/01/2024 @ 2203 BY AB    Culture (A)  Final    ESCHERICHIA COLI SUSCEPTIBILITIES TO FOLLOW Performed at [facility], [address], [place], [place] [address]    Report Status PENDING  Incomplete  Blood Culture ID Panel (Reflexed)     Status: Abnormal   Collection Time: 12/31/23  1:15 PM  Result Value Ref Range Status   Enterococcus faecalis NOT DETECTED NOT DETECTED Final   Enterococcus Faecium NOT DETECTED NOT DETECTED Final   Listeria monocytogenes NOT DETECTED NOT DETECTED Final   Staphylococcus species NOT DETECTED NOT DETECTED Final   Staphylococcus aureus (BCID) NOT DETECTED NOT DETECTED Final   Staphylococcus epidermidis NOT DETECTED NOT DETECTED Final   Staphylococcus lugdunensis NOT DETECTED NOT DETECTED Final   Streptococcus species NOT DETECTED NOT DETECTED Final   Streptococcus agalactiae NOT DETECTED NOT DETECTED Final   Streptococcus pneumoniae NOT DETECTED NOT  DETECTED Final   Streptococcus pyogenes NOT DETECTED NOT DETECTED Final   A.calcoaceticus-baumannii NOT DETECTED NOT DETECTED Final   Bacteroides fragilis NOT DETECTED NOT DETECTED Final   Enterobacterales DETECTED (A) NOT DETECTED Final    Comment: Enterobacterales represent a large order of gram negative bacteria, not a single organism. CRITICAL RESULT CALLED TO, READ BACK BY AND VERIFIED WITH: K. [facility] RN 01/01/2024 @ 2203 BY AB    Enterobacter cloacae complex NOT DETECTED NOT DETECTED Final   Escherichia coli DETECTED (A) NOT DETECTED Final    Comment: CRITICAL RESULT CALLED TO, READ BACK BY AND VERIFIED WITH: K. [facility] RN 01/01/2024 @ 2203 BY AB    Klebsiella aerogenes NOT DETECTED NOT DETECTED Final   Klebsiella oxytoca NOT DETECTED NOT DETECTED Final   Klebsiella pneumoniae NOT  DETECTED NOT DETECTED Final   Proteus species NOT DETECTED NOT DETECTED Final   Salmonella species NOT DETECTED NOT DETECTED Final   Serratia marcescens NOT DETECTED NOT DETECTED Final   Haemophilus influenzae NOT DETECTED NOT DETECTED Final   Neisseria meningitidis NOT DETECTED NOT DETECTED Final   Pseudomonas aeruginosa NOT DETECTED NOT DETECTED Final   Stenotrophomonas maltophilia NOT DETECTED NOT DETECTED Final   Candida albicans NOT DETECTED NOT DETECTED Final   Candida auris NOT DETECTED NOT DETECTED Final   Candida glabrata NOT DETECTED NOT DETECTED Final   Candida krusei NOT DETECTED NOT DETECTED Final   Candida parapsilosis NOT DETECTED NOT DETECTED Final   Candida tropicalis NOT DETECTED NOT DETECTED Final   Cryptococcus neoformans/gattii NOT DETECTED NOT DETECTED Final   CTX-M ESBL NOT DETECTED NOT DETECTED Final   Carbapenem resistance IMP NOT DETECTED NOT DETECTED Final   Carbapenem resistance KPC NOT DETECTED NOT DETECTED Final   Carbapenem resistance NDM NOT DETECTED NOT DETECTED Final   Carbapenem resist OXA 48 LIKE NOT DETECTED NOT DETECTED Final   Carbapenem resistance VIM NOT  DETECTED NOT DETECTED Final    Comment: Performed at [facility], [address] [place], [place] [address]  MRSA Next Gen by PCR, Nasal     Status: None   Collection Time: 12/31/23  2:32 PM   Specimen: Nasal Mucosa; Nasal Swab  Result Value Ref Range Status   MRSA by PCR Next Gen NOT DETECTED NOT DETECTED Final    Comment: (NOTE) The GeneXpert MRSA Assay (FDA approved for NASAL specimens only), is one component of a comprehensive MRSA colonization surveillance program. It is not intended to diagnose MRSA infection nor to guide or monitor treatment for MRSA infections. Test performance is not FDA approved in patients less than 56 years old. Performed at [facility], [address] [place], [place] [address]   Resp panel by RT-PCR (RSV, Flu A&B, Covid) Anterior Nasal Swab     Status: None   Collection Time: 12/31/23  2:33 PM   Specimen: Anterior Nasal Swab  Result Value Ref Range Status   SARS Coronavirus 2 by RT PCR NEGATIVE NEGATIVE Final    Comment: (NOTE) SARS-CoV-2 target nucleic acids are NOT DETECTED.  The SARS-CoV-2 RNA is generally detectable in upper respiratory specimens during the acute phase of infection. The lowest concentration of SARS-CoV-2 viral copies this assay can detect is 138 copies/mL. A negative result does not preclude SARS-Cov-2 infection and should not be used as the sole basis for treatment or other patient management decisions. A negative result may occur with  improper specimen collection/handling, submission of specimen other than nasopharyngeal swab, presence of viral mutation(s) within the areas targeted by this assay, and inadequate number of viral copies(<138 copies/mL). A negative result must be combined with clinical observations, patient history, and epidemiological information. The expected result is Negative.  Fact Sheet for Patients:  BloggerCourse.com  Fact Sheet for Healthcare Providers:   SeriousBroker.it  This test is no t yet approved or cleared by the [place]  FDA and  has been authorized for detection and/or diagnosis of SARS-CoV-2 by FDA under an Emergency Use Authorization (EUA). This EUA will remain  in effect (meaning this test can be used) for the duration of the COVID-19 declaration under Section 564(b)(1) of the Act, 21 U.S.C.section 360bbb-3(b)(1), unless the authorization is terminated  or revoked sooner.       Influenza A by PCR NEGATIVE NEGATIVE Final   Influenza B by PCR NEGATIVE  NEGATIVE Final    Comment: (NOTE) The Xpert Xpress SARS-CoV-2/FLU/RSV plus assay is intended as an aid in the diagnosis of influenza from Nasopharyngeal swab specimens and should not be used as a sole basis for treatment. Nasal washings and aspirates are unacceptable for Xpert Xpress SARS-CoV-2/FLU/RSV testing.  Fact Sheet for Patients: BloggerCourse.com  Fact Sheet for Healthcare Providers: SeriousBroker.it  This test is not yet approved or cleared by the [place]  FDA and has been authorized for detection and/or diagnosis of SARS-CoV-2 by FDA under an Emergency Use Authorization (EUA). This EUA will remain in effect (meaning this test can be used) for the duration of the COVID-19 declaration under Section 564(b)(1) of the Act, 21 U.S.C. section 360bbb-3(b)(1), unless the authorization is terminated or revoked.     Resp Syncytial Virus by PCR NEGATIVE NEGATIVE Final    Comment: (NOTE) Fact Sheet for Patients: BloggerCourse.com  Fact Sheet for Healthcare Providers: SeriousBroker.it  This test is not yet approved or cleared by the [place]  FDA and has been authorized for detection and/or diagnosis of SARS-CoV-2 by FDA under an Emergency Use Authorization (EUA). This EUA will remain in effect (meaning this test can be used) for  the duration of the COVID-19 declaration under Section 564(b)(1) of the Act, 21 U.S.C. section 360bbb-3(b)(1), unless the authorization is terminated or revoked.  Performed at [facility], [address] [place], [place] [address]   Respiratory (~20 pathogens) panel by PCR     Status: None   Collection Time: 12/31/23  3:04 PM   Specimen: Nasopharyngeal Swab; Respiratory  Result Value Ref Range Status   Adenovirus NOT DETECTED NOT DETECTED Final   Coronavirus 229E NOT DETECTED NOT DETECTED Final    Comment: (NOTE) The Coronavirus on the Respiratory Panel, DOES NOT test for the novel  Coronavirus (2019 nCoV)    Coronavirus HKU1 NOT DETECTED NOT DETECTED Final   Coronavirus NL63 NOT DETECTED NOT DETECTED Final   Coronavirus OC43 NOT DETECTED NOT DETECTED Final   Metapneumovirus NOT DETECTED NOT DETECTED Final   Rhinovirus / Enterovirus NOT DETECTED NOT DETECTED Final   Influenza A NOT DETECTED NOT DETECTED Final   Influenza B NOT DETECTED NOT DETECTED Final   Parainfluenza Virus 1 NOT DETECTED NOT DETECTED Final   Parainfluenza Virus 2 NOT DETECTED NOT DETECTED Final   Parainfluenza Virus 3 NOT DETECTED NOT DETECTED Final   Parainfluenza Virus 4 NOT DETECTED NOT DETECTED Final   Respiratory Syncytial Virus NOT DETECTED NOT DETECTED Final   Bordetella pertussis NOT DETECTED NOT DETECTED Final   Bordetella Parapertussis NOT DETECTED NOT DETECTED Final   Chlamydophila pneumoniae NOT DETECTED NOT DETECTED Final   Mycoplasma pneumoniae NOT DETECTED NOT DETECTED Final    Comment: Performed at [facility], [address], [place], [place] [address]  Culture, blood (Routine X 2) w Reflex to ID Panel     Status: None (Preliminary result)   Collection Time: 12/31/23  3:37 PM   Specimen: BLOOD  Result Value Ref Range Status   Specimen Description BLOOD RIGHT ANTECUBITAL  Final   Special Requests   Final    BOTTLES DRAWN AEROBIC AND ANAEROBIC Blood Culture adequate volume   Culture    Final    NO GROWTH 3 DAYS Performed at [facility], [address] [place], [place] [address]    Report Status PENDING  Incomplete    Labs: CBC: Recent Labs  Lab 12/31/23 1037 01/01/24 0505 01/02/24 0437 01/03/24 0543  WBC 7.3 9.6 5.9 5.3  NEUTROABS 6.8 8.3* 4.9  --   HGB 13.4 10.9* 10.8* 11.4*  HCT 40.4 31.9* 32.2* 33.8*  MCV 92.2 91.7 92.3 90.9  PLT 155 139* 101* 142*   Basic Metabolic Panel: Recent Labs  Lab 12/31/23 1037 01/01/24 0505 01/02/24 0437 01/03/24 0543  NA 137 134* 138 138  K 4.4 3.7 3.6 3.5  CL 104 104 108 107  CO2 22 23 22 23   GLUCOSE 258* 129* 233* 159*  BUN 27* 24* 19 12  CREATININE 0.95 1.05 0.86 0.66  CALCIUM 10.0 8.6* 9.0 9.5  MG  --   --  1.3* 1.6*   Liver Function Tests: Recent Labs  Lab 12/31/23 1037 01/01/24 0505 01/02/24 0437 01/03/24 0543  AST 1,445* 436* 168* 154*  ALT 912* 717* 478* 435*  ALKPHOS 126 89 106 152*  BILITOT 2.6* 3.9* 2.2* 1.6*  PROT 7.3 5.4* 5.8* 6.7  ALBUMIN 4.2 3.0* 3.2* 3.7   CBG: Recent Labs  Lab 01/02/24 1142 01/02/24 1614 01/02/24 2100 01/03/24 0721 01/03/24 1129  GLUCAP 180* 230* 265* 153* 178*    Discharge time spent: greater than 30 minutes.  Signed: Alm Schneider, MD [facility] 01/03/2024

## 2024-01-03 NOTE — Telephone Encounter (Signed)
 Patient needs hospital follow up in 2 weeks with Dr. Cindie or Charmaine as they saw patient while in hospital and not seen previously by another provider.   Please let me know if difficulty finding an appt in the timeframe requested.

## 2024-01-03 NOTE — Congregational Nurse Program (Signed)
 Discharge instructions reviewed, Home medications returned to patient from pharmacy. Pt dressed and belongings returned, walked to car by NT.

## 2024-01-04 ENCOUNTER — Telehealth: Payer: Self-pay

## 2024-01-04 ENCOUNTER — Telehealth: Payer: Self-pay | Admitting: Gastroenterology

## 2024-01-04 LAB — CULTURE, BLOOD (ROUTINE X 2): Special Requests: ADEQUATE

## 2024-01-04 LAB — ANTI-SMOOTH MUSCLE ANTIBODY, IGG: F-Actin IgG: 6 U (ref 0–19)

## 2024-01-04 NOTE — Telephone Encounter (Signed)
 Patient number is not working, called patient son he stated patient would call us  back to schedule appointment.

## 2024-01-04 NOTE — Transitions of Care (Post Inpatient/ED Visit) (Signed)
   01/04/2024  Name: Colton Johnson MRN: [id number] DOB: Mar 15, 1941  Today's TOC FU Call Status: Today's TOC FU Call Status:: Unsuccessful Call (1st Attempt) Unsuccessful Call (1st Attempt) Date: 01/04/24  Attempted to reach the patient regarding the most recent Inpatient/ED visit.  Follow Up Plan: Additional outreach attempts will be made to reach the patient to complete the Transitions of Care (Post Inpatient/ED visit) call.   Brett Soza J. Kemyah Buser RN, MSN [facility], [facility] RN Care Manager Direct Dial: [phone]  Fax: [phone] Website: [email or web address]

## 2024-01-05 ENCOUNTER — Telehealth: Payer: Self-pay

## 2024-01-05 DIAGNOSIS — R7401 Elevation of levels of liver transaminase levels: Secondary | ICD-10-CM | POA: Diagnosis not present

## 2024-01-05 DIAGNOSIS — A419 Sepsis, unspecified organism: Secondary | ICD-10-CM | POA: Diagnosis not present

## 2024-01-05 DIAGNOSIS — B9620 Unspecified Escherichia coli [E. coli] as the cause of diseases classified elsewhere: Secondary | ICD-10-CM | POA: Diagnosis not present

## 2024-01-05 DIAGNOSIS — D649 Anemia, unspecified: Secondary | ICD-10-CM | POA: Diagnosis not present

## 2024-01-05 DIAGNOSIS — R6520 Severe sepsis without septic shock: Secondary | ICD-10-CM | POA: Diagnosis not present

## 2024-01-05 DIAGNOSIS — I10 Essential (primary) hypertension: Secondary | ICD-10-CM | POA: Diagnosis not present

## 2024-01-05 DIAGNOSIS — R7881 Bacteremia: Secondary | ICD-10-CM | POA: Diagnosis not present

## 2024-01-05 DIAGNOSIS — E782 Mixed hyperlipidemia: Secondary | ICD-10-CM | POA: Diagnosis not present

## 2024-01-05 LAB — CULTURE, BLOOD (ROUTINE X 2)
Culture: NO GROWTH
Special Requests: ADEQUATE

## 2024-01-05 NOTE — Transitions of Care (Post Inpatient/ED Visit) (Signed)
   01/05/2024  Name: Colton Johnson MRN: [id number] DOB: 28-Mar-1941  Today's TOC FU Call Status: Today's TOC FU Call Status:: Unsuccessful Call (2nd Attempt) Unsuccessful Call (2nd Attempt) Date: 01/05/24  Attempted to reach the patient regarding the most recent Inpatient/ED visit.  Follow Up Plan: Additional outreach attempts will be made to reach the patient to complete the Transitions of Care (Post Inpatient/ED visit) call.   Courtland Reas J. [name] RN, MSN [facility], [facility] RN Care Manager Direct Dial: [phone]  Fax: [phone] Website: [email or web address]

## 2024-01-06 ENCOUNTER — Telehealth: Payer: Self-pay

## 2024-01-06 NOTE — Transitions of Care (Post Inpatient/ED Visit) (Signed)
   01/06/2024  Name: Colton Johnson MRN: [id number] DOB: May 29, 1940  Today's TOC FU Call Status: Today's TOC FU Call Status:: Unsuccessful Call (3rd Attempt) Unsuccessful Call (3rd Attempt) Date: 01/06/24  Attempted to reach the patient regarding the most recent Inpatient/ED visit.  Follow Up Plan: No further outreach attempts will be made at this time. We have been unable to contact the patient.  Amaree Leeper J. Shaye Elling RN, MSN [facility], [facility] RN Care Manager Direct Dial: [phone]  Fax: [phone] Website: [email or web address]

## 2024-01-07 LAB — IMMUNOGLOBULINS A/E/G/M, SERUM
IgA: 141 mg/dL (ref 61–437)
IgE (Immunoglobulin E), Serum: 73 [IU]/mL (ref 6–495)
IgG (Immunoglobin G), Serum: 710 mg/dL (ref 603–1613)
IgM (Immunoglobulin M), Srm: 27 mg/dL (ref 15–143)

## 2024-01-12 ENCOUNTER — Encounter (HOSPITAL_COMMUNITY): Payer: Self-pay

## 2024-01-12 ENCOUNTER — Other Ambulatory Visit: Payer: Self-pay

## 2024-01-12 ENCOUNTER — Emergency Department (HOSPITAL_COMMUNITY)
Admission: EM | Admit: 2024-01-12 | Discharge: 2024-01-12 | Disposition: A | Discharge status: Home / Self Care | Source: Ambulatory Visit | Attending: Emergency Medicine | Admitting: Emergency Medicine

## 2024-01-12 DIAGNOSIS — R7401 Elevation of levels of liver transaminase levels: Secondary | ICD-10-CM | POA: Diagnosis not present

## 2024-01-12 LAB — CBC WITH DIFFERENTIAL/PLATELET
Abs Immature Granulocytes: 0.02 K/uL (ref 0.00–0.07)
Basophils Absolute: 0 K/uL (ref 0.0–0.1)
Basophils Relative: 1 %
Eosinophils Absolute: 0.1 K/uL (ref 0.0–0.5)
Eosinophils Relative: 2 %
HCT: 42.6 % (ref 39.0–52.0)
Hemoglobin: 14.3 g/dL (ref 13.0–17.0)
Immature Granulocytes: 0 %
Lymphocytes Relative: 28 %
Lymphs Abs: 1.3 K/uL (ref 0.7–4.0)
MCH: 31.2 pg (ref 26.0–34.0)
MCHC: 33.6 g/dL (ref 30.0–36.0)
MCV: 92.8 fL (ref 80.0–100.0)
Monocytes Absolute: 0.4 K/uL (ref 0.1–1.0)
Monocytes Relative: 8 %
Neutro Abs: 2.9 K/uL (ref 1.7–7.7)
Neutrophils Relative %: 61 %
Platelets: 189 K/uL (ref 150–400)
RBC: 4.59 MIL/uL (ref 4.22–5.81)
RDW: 13.5 % (ref 11.5–15.5)
WBC: 4.7 K/uL (ref 4.0–10.5)
nRBC: 0 % (ref 0.0–0.2)

## 2024-01-12 LAB — COMPREHENSIVE METABOLIC PANEL WITH GFR
ALT: 47 U/L — ABNORMAL HIGH (ref 0–44)
AST: 26 U/L (ref 15–41)
Albumin: 4.4 g/dL (ref 3.5–5.0)
Alkaline Phosphatase: 106 U/L (ref 38–126)
Anion gap: 15 (ref 5–15)
BUN: 38 mg/dL — ABNORMAL HIGH (ref 8–23)
CO2: 19 mmol/L — ABNORMAL LOW (ref 22–32)
Calcium: 10.5 mg/dL — ABNORMAL HIGH (ref 8.9–10.3)
Chloride: 102 mmol/L (ref 98–111)
Creatinine, Ser: 1.2 mg/dL (ref 0.61–1.24)
GFR, Estimated: >60 mL/min (ref >60)
Glucose, Bld: 200 mg/dL — ABNORMAL HIGH (ref 70–99)
Potassium: 4.3 mmol/L (ref 3.5–5.1)
Sodium: 136 mmol/L (ref 135–145)
Total Bilirubin: 1 mg/dL (ref 0.0–1.2)
Total Protein: 8 g/dL (ref 6.5–8.1)

## 2024-01-12 LAB — MAGNESIUM: Magnesium: 1.5 mg/dL — ABNORMAL LOW (ref 1.7–2.4)

## 2024-01-12 NOTE — Discharge Instructions (Signed)
 Thankfully your testing looks back to normal, your liver tests have resolved, please continue to take the magnesium  until you follow-up with Dr. Shona, please call his office today to make a follow-up to be seen within the next 10 days for recheck of blood work.  In the meantime if things are getting worse with more pain vomiting fevers or any other symptoms return to the ER immediately

## 2024-01-12 NOTE — ED Provider Notes (Signed)
 [place] EMERGENCY DEPARTMENT AT [facility] Provider Note   CSN: [id number] Arrival date & time: 01/12/24  1148     Patient presents with: abnormal labs   Colton Johnson is a 83 y.o. male.   HPI   This patient is an 83 year old male, he has a history of recently being admitted to the hospital with a liver injury or liver failure, he was discharged from the hospital on 9 September, during that admission it was noticed that his liver function test was significantly abnormal with levels of transaminitis above 1000 and a bilirubin that was only mildly elevated, he had not been using acetaminophen , he had not been drinking alcohol, the patient was thought to possibly have had sepsis on his evaluation, the patient was discharged home on cefdinir  which she has now finished, hepatitis panel was negative.  He has been concerned about his levels and wanted to have them rechecked ago he states that he is not having any symptoms whatsoever including abdominal pain nausea fevers jaundice or any other complaints  Prior to Admission medications   Medication Sig Start Date End Date Taking? Authorizing Provider  cefdinir  (OMNICEF ) 300 MG capsule Take 1 capsule (300 mg total) by mouth 2 (two) times daily. 01/03/24   Evonnie Lenis, MD  cyanocobalamin  (VITAMIN B12) 1000 MCG/ML injection Inject 1,000 mcg into the skin every 30 (thirty) days. 09/05/23   [provider]  losartan (COZAAR) 25 MG tablet Take 25 mg by mouth daily. for high blood pressure    [provider]  magnesium  oxide (MAG-OX) 400 (240 Mg) MG tablet Take 1 tablet (400 mg total) by mouth daily. 01/04/24   Evonnie Lenis, MD  metFORMIN (GLUCOPHAGE) 1000 MG tablet Take 1,000 mg by mouth 2 (two) times daily.    [provider]  OMEGA FATTY ACIDS-VITAMINS PO Take 1 capsule by mouth daily. Omega XL - omega 3 fatty acids, DHA, EPA, vit E, olive oil, green lipped mussel oil extract    [provider]  Omega-3  1000 MG CAPS Take 1 capsule by mouth daily.    [provider]  omeprazole (PRILOSEC) 20 MG capsule Take 20 mg by mouth as needed (acid reflux, heartburn).    [provider]  polyethylene glycol powder (MIRALAX ) 17 GM/SCOOP powder Take 3-5 scoops once to twice daily as needed for current constipation, may decrease to one scoop once daily once symptoms resolve Patient taking differently: Take 119 g by mouth 2 (two) times daily as needed for moderate constipation. Take 3-5 scoops once to twice daily as needed for current constipation, may decrease to one scoop once daily once symptoms resolve 11/10/23   Stuart Vernell Norris, PA-C  tamsulosin (FLOMAX) 0.4 MG CAPS capsule Take 0.4 mg by mouth daily.    [provider]  traMADol (ULTRAM) 50 MG tablet Take 50 mg by mouth every 8 (eight) hours as needed for severe pain (pain score 7-10). 08/03/19   [provider]  triamterene-hydrochlorothiazide (DYAZIDE) 37.5-25 MG capsule Take 1 capsule by mouth daily.    [provider]  vitamin B-12 (CYANOCOBALAMIN ) 100 MCG tablet Take 100 mcg by mouth daily.    [provider]    Allergies: Aleve [naproxen sodium] and Other    Review of Systems  All other systems reviewed and are negative.   Updated Vital Signs BP 120/70 (BP Location: Right Arm)   Pulse 65   Temp 97.7 F (36.5 C) (Temporal)   Resp 16   Ht  1.829 m (6')   Wt 71.7 kg   SpO2 97%   BMI 21.44 kg/m   Physical Exam Vitals and nursing note reviewed.  Constitutional:      General: He is not in acute distress.    Appearance: He is well-developed.  HENT:     Head: Normocephalic and atraumatic.     Mouth/Throat:     Pharynx: No oropharyngeal exudate.  Eyes:     General: No scleral icterus.       Right eye: No discharge.        Left eye: No discharge.     Conjunctiva/sclera: Conjunctivae normal.     Pupils: Pupils are equal, round, and reactive to light.  Neck:     Thyroid : No  thyromegaly.     Vascular: No JVD.  Cardiovascular:     Rate and Rhythm: Normal rate and regular rhythm.     Heart sounds: Normal heart sounds. No murmur heard.    No friction rub. No gallop.  Pulmonary:     Effort: Pulmonary effort is normal. No respiratory distress.     Breath sounds: Normal breath sounds. No wheezing or rales.  Abdominal:     General: Bowel sounds are normal. There is no distension.     Palpations: Abdomen is soft. There is no mass.     Tenderness: There is no abdominal tenderness.  Musculoskeletal:        General: No tenderness. Normal range of motion.     Cervical back: Normal range of motion and neck supple.  Lymphadenopathy:     Cervical: No cervical adenopathy.  Skin:    General: Skin is warm and dry.     Findings: No erythema or rash.  Neurological:     Mental Status: He is alert.     Coordination: Coordination normal.  Psychiatric:        Behavior: Behavior normal.     (all labs ordered are listed, but only abnormal results are displayed) Labs Reviewed  COMPREHENSIVE METABOLIC PANEL WITH GFR - Abnormal; Notable for the following components:      Result Value   CO2 19 (*)    Glucose, Bld 200 (*)    BUN 38 (*)    Calcium 10.5 (*)    ALT 47 (*)    All other components within normal limits  MAGNESIUM  - Abnormal; Notable for the following components:   Magnesium  1.5 (*)    All other components within normal limits  CBC WITH DIFFERENTIAL/PLATELET    EKG: None  Radiology: No results found.   Procedures   Medications Ordered in the ED - No data to display                                  Medical Decision Making Amount and/or Complexity of Data Reviewed Labs: ordered.   Exam is unremarkable, there is no hepatosplenomegaly no tenderness no jaundice normal cardiovascular exam normal pulmonary exam no edema, the patient appears well, feels well and his labs look back to normal.  He has no significant transaminitis present, his vital signs  are reassuring, he is stable for discharge  He still has mild hypomagnesemia but has a bottle of magnesium  that he is taking every day, this can be checked outpatient     Final diagnoses:  Transaminitis    ED Discharge Orders     None  Cleotilde Rogue, MD 01/12/24 [phone]

## 2024-01-12 NOTE — ED Triage Notes (Signed)
 Pt arrived via POV after reporting he tried speaking to his PCP for follow-up on his blood work from recent admission, but reports my doctor ain't worth a damn. Pt verbalizes concern of his liver and magnesium  levels. Pt ambulatory in Triage and presents in NAD.

## 2024-01-12 NOTE — ED Notes (Signed)
 ED Provider at bedside.

## 2024-02-02 ENCOUNTER — Ambulatory Visit: Admitting: Internal Medicine

## 2024-02-02 VITALS — BP 153/74 | HR 78 | Temp 98.6°F | Ht 72.0 in | Wt 158.8 lb

## 2024-02-02 DIAGNOSIS — I10 Essential (primary) hypertension: Secondary | ICD-10-CM | POA: Diagnosis not present

## 2024-02-02 DIAGNOSIS — K5909 Other constipation: Secondary | ICD-10-CM

## 2024-02-02 DIAGNOSIS — K5900 Constipation, unspecified: Secondary | ICD-10-CM

## 2024-02-02 DIAGNOSIS — R748 Abnormal levels of other serum enzymes: Secondary | ICD-10-CM

## 2024-02-02 NOTE — Progress Notes (Signed)
 Referring Provider: Shona Norleen PEDLAR, MD Primary Care Physician:  Shona Norleen PEDLAR, MD Primary GI:  Dr. Cindie  Chief Complaint  Patient presents with   New Patient (Initial Visit)    Pt here for followup ED visit    HPI:   Colton Johnson is a 83 y.o. male who presents to the clinic today for hospital follow-up visit.  Patient has a past medical history of diabetes, hypertension, dyslipidemia, vitamin D and B12 deficiencies.  Elevated LFTs: Patient admitted to [facility] 12/31/2023 after initially presenting with nausea, epigastric pain, dry heaving. In the ER, patient initially triaged with fever and tachycardia prompting SIRS workup.  Found to have elevated LFTs with AST 1445, ALT 912, alk phos 126, T. bili 2.6, ammonia 52.  ASA level undetectable, acetaminophen  level undetectable.  UDS negative.  Alcohol level negative.  HIV, CMV, EBV negative.  Autoimmune markers unremarkable.   CT abdomen pelvis  was unremarkable.  Right upper quadrant ultrasound also negative for any acute abnormality.  INR 1.0.  Patient admitted for severe sepsis in the setting of bacteremia.  Liver enzymes steadily improved during hospital admission with supportive care and antibiotics.  CMP 01/12/2024 showed LFTs had essentially normalized with AST 26, ALT 47, alk phos 106, T. bili 1.0.  Chronic constipation: Mild, intermittent.  Taking MiraLAX  as needed.  States this is helping his symptoms.  Denies any melena or hematochezia.  No abdominal pain.    Past Medical History:  Diagnosis Date   Diabetes mellitus without complication (HCC)    Hypercholesterolemia    Hypertension     Past Surgical History:  Procedure Laterality Date   BACK SURGERY      Current Outpatient Medications  Medication Sig Dispense Refill   cyanocobalamin  (VITAMIN B12) 1000 MCG/ML injection Inject 1,000 mcg into the skin every 30 (thirty) days.     losartan (COZAAR) 25 MG tablet Take 25 mg by mouth daily. for high blood  pressure     magnesium  oxide (MAG-OX) 400 (240 Mg) MG tablet Take 1 tablet (400 mg total) by mouth daily. 30 tablet 0   metFORMIN (GLUCOPHAGE) 1000 MG tablet Take 1,000 mg by mouth 2 (two) times daily.     Omega-3 1000 MG CAPS Take 1 capsule by mouth daily.     polyethylene glycol powder (MIRALAX ) 17 GM/SCOOP powder Take 3-5 scoops once to twice daily as needed for current constipation, may decrease to one scoop once daily once symptoms resolve 255 g 0   tamsulosin (FLOMAX) 0.4 MG CAPS capsule Take 0.4 mg by mouth daily.     traMADol (ULTRAM) 50 MG tablet Take 50 mg by mouth every 8 (eight) hours as needed for severe pain (pain score 7-10).     triamterene-hydrochlorothiazide (DYAZIDE) 37.5-25 MG capsule Take 1 capsule by mouth daily.     vitamin B-12 (CYANOCOBALAMIN ) 100 MCG tablet Take 100 mcg by mouth daily.     cefdinir  (OMNICEF ) 300 MG capsule Take 1 capsule (300 mg total) by mouth 2 (two) times daily. (Patient not taking: Reported on 02/02/2024) 12 capsule 0   OMEGA FATTY ACIDS-VITAMINS PO Take 1 capsule by mouth daily. Omega XL - omega 3 fatty acids, DHA, EPA, vit E, olive oil, green lipped mussel oil extract (Patient not taking: Reported on 02/02/2024)     omeprazole (PRILOSEC) 20 MG capsule Take 20 mg by mouth as needed (acid reflux, heartburn). (Patient not taking: Reported on 02/02/2024)     No current facility-administered medications for  this visit.    Allergies as of 02/02/2024 - Review Complete 02/02/2024  Allergen Reaction Noted   Aleve [naproxen sodium] Itching 05/11/2017   Other Swelling 09/12/2011    Family History  Problem Relation Age of Onset   Diabetes Mother    Heart disease Father    Cancer Brother     Social History   Socioeconomic History   Marital status: Divorced    Spouse name: Not on file   Number of children: Not on file   Years of education: Not on file   Highest education level: Not on file  Occupational History   Not on file  Tobacco Use    Smoking status: Never    Passive exposure: Never   Smokeless tobacco: Current    Types: Chew  Vaping Use   Vaping status: Never Used  Substance and Sexual Activity   Alcohol use: No   Drug use: No   Sexual activity: Not on file  Other Topics Concern   Not on file  Social History Narrative   Not on file   Social Drivers of Health   Financial Resource Strain: Not on file  Food Insecurity: No Food Insecurity (12/31/2023)   Hunger Vital Sign    Worried About Running Out of Food in the Last Year: Never true    Ran Out of Food in the Last Year: Never true  Transportation Needs: No Transportation Needs (12/31/2023)   PRAPARE - Administrator, Civil Service (Medical): No    Lack of Transportation (Non-Medical): No  Physical Activity: Not on file  Stress: Not on file  Social Connections: Moderately Isolated (12/31/2023)   Social Connection and Isolation Panel    Frequency of Communication with Friends and Family: Once a week    Frequency of Social Gatherings with Friends and Family: Never    Attends Religious Services: 1 to 4 times per year    Active Member of [employer] or Organizations: No    Attends Engineer, structural: 1 to 4 times per year    Marital Status: Divorced    Subjective: Review of Systems  Constitutional:  Negative for chills and fever.  HENT:  Negative for congestion and hearing loss.   Eyes:  Negative for blurred vision and double vision.  Respiratory:  Negative for cough and shortness of breath.   Cardiovascular:  Negative for chest pain and palpitations.  Gastrointestinal:  Negative for abdominal pain, blood in stool, constipation, diarrhea, heartburn, melena and vomiting.  Genitourinary:  Negative for dysuria and urgency.  Musculoskeletal:  Negative for joint pain and myalgias.  Skin:  Negative for itching and rash.  Neurological:  Negative for dizziness and headaches.  Psychiatric/Behavioral:  Negative for depression. The patient is not  nervous/anxious.      Objective: BP (!) 153/74   Pulse 78   Temp 98.6 F (37 C)   Ht 6' (1.829 m)   Wt 158 lb 12.8 oz (72 kg)   BMI 21.54 kg/m  Physical Exam Constitutional:      Appearance: Normal appearance.  HENT:     Head: Normocephalic and atraumatic.  Eyes:     Extraocular Movements: Extraocular movements intact.     Conjunctiva/sclera: Conjunctivae normal.  Cardiovascular:     Rate and Rhythm: Normal rate and regular rhythm.  Pulmonary:     Effort: Pulmonary effort is normal.     Breath sounds: Normal breath sounds.  Abdominal:     General: Bowel sounds are normal.  Palpations: Abdomen is soft.  Musculoskeletal:        General: Normal range of motion.     Cervical back: Normal range of motion and neck supple.  Skin:    General: Skin is warm.  Neurological:     General: No focal deficit present.     Mental Status: He is alert and oriented to person, place, and time.  Psychiatric:        Mood and Affect: Mood normal.        Behavior: Behavior normal.      Assessment/Plan:  1.  Elevated LFTs - significantly elevated LFTs during recent hospitalization likely due to shock liver/transient hypotension in setting of severe sepsis/bacteremia. Most recent blood work showed these have essentially normalized. Serological work up unremarkable. CT and US  reassuring. Continue to monitor.   2.  Chronic constipation - taking Miralax  as needed. Well controlled.  3.  Hypertension- The patient was found to have elevated blood pressure when vital signs were checked in the office. The blood pressure was rechecked by the nursing staff, and it was found be persistently elevated >140/90 mmHg. I personally advised the patient to follow up closely with the PCP for hypertension control.   Follow up in 6 months.   02/02/2024 8:40 AM

## 2024-02-02 NOTE — Patient Instructions (Signed)
 I am happy to see that your liver tests have completely normalized.  Continue on MiraLAX  as needed for your constipation.  Follow-up in 6 months or sooner if needed.  Dr. Cindie

## 2024-02-08 DIAGNOSIS — E559 Vitamin D deficiency, unspecified: Secondary | ICD-10-CM | POA: Diagnosis not present

## 2024-02-08 DIAGNOSIS — E538 Deficiency of other specified B group vitamins: Secondary | ICD-10-CM | POA: Diagnosis not present

## 2024-02-08 DIAGNOSIS — D649 Anemia, unspecified: Secondary | ICD-10-CM | POA: Diagnosis not present

## 2024-02-08 DIAGNOSIS — Z8619 Personal history of other infectious and parasitic diseases: Secondary | ICD-10-CM | POA: Diagnosis not present

## 2024-02-08 DIAGNOSIS — R7989 Other specified abnormal findings of blood chemistry: Secondary | ICD-10-CM | POA: Diagnosis not present

## 2024-02-08 DIAGNOSIS — I10 Essential (primary) hypertension: Secondary | ICD-10-CM | POA: Diagnosis not present

## 2024-02-08 DIAGNOSIS — Z0001 Encounter for general adult medical examination with abnormal findings: Secondary | ICD-10-CM | POA: Diagnosis not present

## 2024-02-08 DIAGNOSIS — K219 Gastro-esophageal reflux disease without esophagitis: Secondary | ICD-10-CM | POA: Diagnosis not present

## 2024-02-08 DIAGNOSIS — Z0000 Encounter for general adult medical examination without abnormal findings: Secondary | ICD-10-CM | POA: Diagnosis not present

## 2024-03-12 ENCOUNTER — Emergency Department (HOSPITAL_COMMUNITY)

## 2024-03-12 ENCOUNTER — Emergency Department (HOSPITAL_COMMUNITY)
Admission: EM | Admit: 2024-03-12 | Discharge: 2024-03-12 | Disposition: A | Discharge status: Home / Self Care | Attending: Emergency Medicine | Admitting: Emergency Medicine

## 2024-03-12 ENCOUNTER — Encounter (HOSPITAL_COMMUNITY): Payer: Self-pay | Admitting: Emergency Medicine

## 2024-03-12 ENCOUNTER — Other Ambulatory Visit: Payer: Self-pay

## 2024-03-12 DIAGNOSIS — M25551 Pain in right hip: Secondary | ICD-10-CM | POA: Diagnosis not present

## 2024-03-12 DIAGNOSIS — K8689 Other specified diseases of pancreas: Secondary | ICD-10-CM | POA: Diagnosis not present

## 2024-03-12 DIAGNOSIS — M8580 Other specified disorders of bone density and structure, unspecified site: Secondary | ICD-10-CM | POA: Diagnosis not present

## 2024-03-12 DIAGNOSIS — K5750 Diverticulosis of both small and large intestine without perforation or abscess without bleeding: Secondary | ICD-10-CM | POA: Diagnosis not present

## 2024-03-12 DIAGNOSIS — F17200 Nicotine dependence, unspecified, uncomplicated: Secondary | ICD-10-CM | POA: Insufficient documentation

## 2024-03-12 DIAGNOSIS — N281 Cyst of kidney, acquired: Secondary | ICD-10-CM | POA: Diagnosis not present

## 2024-03-12 DIAGNOSIS — K6389 Other specified diseases of intestine: Secondary | ICD-10-CM | POA: Diagnosis not present

## 2024-03-12 DIAGNOSIS — R1084 Generalized abdominal pain: Secondary | ICD-10-CM | POA: Diagnosis not present

## 2024-03-12 LAB — COMPREHENSIVE METABOLIC PANEL WITH GFR
ALT: 12 U/L (ref 0–44)
AST: 14 U/L — ABNORMAL LOW (ref 15–41)
Albumin: 4.8 g/dL (ref 3.5–5.0)
Alkaline Phosphatase: 77 U/L (ref 38–126)
Anion gap: 12 (ref 5–15)
BUN: 17 mg/dL (ref 8–23)
CO2: 26 mmol/L (ref 22–32)
Calcium: 10.4 mg/dL — ABNORMAL HIGH (ref 8.9–10.3)
Chloride: 100 mmol/L (ref 98–111)
Creatinine, Ser: 0.95 mg/dL (ref 0.61–1.24)
GFR, Estimated: >60 mL/min (ref >60)
Glucose, Bld: 139 mg/dL — ABNORMAL HIGH (ref 70–99)
Potassium: 4.3 mmol/L (ref 3.5–5.1)
Sodium: 138 mmol/L (ref 135–145)
Total Bilirubin: 0.4 mg/dL (ref 0.0–1.2)
Total Protein: 7.7 g/dL (ref 6.5–8.1)

## 2024-03-12 LAB — CBC
HCT: 41.5 % (ref 39.0–52.0)
Hemoglobin: 14.3 g/dL (ref 13.0–17.0)
MCH: 30.8 pg (ref 26.0–34.0)
MCHC: 34.5 g/dL (ref 30.0–36.0)
MCV: 89.4 fL (ref 80.0–100.0)
Platelets: 251 K/uL (ref 150–400)
RBC: 4.64 MIL/uL (ref 4.22–5.81)
RDW: 13.4 % (ref 11.5–15.5)
WBC: 10.5 K/uL (ref 4.0–10.5)
nRBC: 0 % (ref 0.0–0.2)

## 2024-03-12 LAB — URINALYSIS, ROUTINE W REFLEX MICROSCOPIC
Bilirubin Urine: NEGATIVE
Glucose, UA: NEGATIVE mg/dL
Hgb urine dipstick: NEGATIVE
Ketones, ur: NEGATIVE mg/dL
Leukocytes,Ua: NEGATIVE
Nitrite: NEGATIVE
Protein, ur: NEGATIVE mg/dL
Specific Gravity, Urine: 1.008 (ref 1.005–1.030)
pH: 6 (ref 5.0–8.0)

## 2024-03-12 LAB — LIPASE, BLOOD: Lipase: 31 U/L (ref 11–51)

## 2024-03-12 MED ORDER — IOHEXOL 300 MG/ML  SOLN
100.0000 mL | Freq: Once | INTRAMUSCULAR | Status: AC | PRN
  Administered 2024-03-12: 100 mL via INTRAVENOUS

## 2024-03-12 NOTE — ED Triage Notes (Signed)
 Pt complains of abd pain and swelling and has been bleaching a lot. States I think I am stopped up Last BM 2 days ago.

## 2024-03-12 NOTE — ED Provider Notes (Signed)
 [place] EMERGENCY DEPARTMENT AT [facility] Provider Note   CSN: [id number] Arrival date & time: 03/12/24  1608     Patient presents with: Abdominal Pain   Colton Johnson is a 83 y.o. male.  He is here with a complaint of a few days of diffuse abdominal discomfort.  Has not moved his bowels for 2 days which is unusual for him.  Belching a lot which seems to help his abdominal discomfort some.  Has tried nothing for his symptoms.  Rates his pain as 10 out of 10.  No fevers chills nausea vomiting.  No urinary symptoms.  {Add pertinent medical, surgical, social history, OB history to YEP:67052} The history is provided by the patient.  Abdominal Pain Pain location:  Generalized Pain quality: aching   Pain severity:  Severe Onset quality:  Gradual Timing:  Constant Progression:  Unchanged Chronicity:  New Relieved by:  Nothing Ineffective treatments:  Belching Associated symptoms: constipation   Associated symptoms: no chest pain, no cough, no dysuria, no fever, no hematemesis, no hematochezia, no hematuria, no nausea, no shortness of breath and no vomiting        Prior to Admission medications   Medication Sig Start Date End Date Taking? Authorizing Provider  cyanocobalamin  (VITAMIN B12) 1000 MCG/ML injection Inject 1,000 mcg into the skin every 30 (thirty) days. 09/05/23   [provider]  losartan (COZAAR) 25 MG tablet Take 25 mg by mouth daily. for high blood pressure    [provider]  magnesium  oxide (MAG-OX) 400 (240 Mg) MG tablet Take 1 tablet (400 mg total) by mouth daily. 01/04/24   Evonnie Lenis, MD  metFORMIN (GLUCOPHAGE) 1000 MG tablet Take 1,000 mg by mouth 2 (two) times daily.    [provider]  Omega-3 1000 MG CAPS Take 1 capsule by mouth daily.    [provider]  polyethylene glycol powder (MIRALAX ) 17 GM/SCOOP powder Take 3-5 scoops once to twice daily as needed for current constipation, may decrease to one scoop  once daily once symptoms resolve 11/10/23   Stuart Vernell Norris, PA-C  tamsulosin (FLOMAX) 0.4 MG CAPS capsule Take 0.4 mg by mouth daily.    [provider]  traMADol (ULTRAM) 50 MG tablet Take 50 mg by mouth every 8 (eight) hours as needed for severe pain (pain score 7-10). 08/03/19   [provider]  triamterene-hydrochlorothiazide (DYAZIDE) 37.5-25 MG capsule Take 1 capsule by mouth daily.    [provider]  vitamin B-12 (CYANOCOBALAMIN ) 100 MCG tablet Take 100 mcg by mouth daily.    [provider]    Allergies: Aleve [naproxen sodium] and Other    Review of Systems  Constitutional:  Negative for fever.  Respiratory:  Negative for cough and shortness of breath.   Cardiovascular:  Negative for chest pain.  Gastrointestinal:  Positive for abdominal pain and constipation. Negative for hematemesis, hematochezia, nausea and vomiting.  Genitourinary:  Negative for dysuria and hematuria.    Updated Vital Signs BP (!) 160/86 (BP Location: Right Arm)   Pulse 94   Temp 98.2 F (36.8 C) (Oral)   Resp 17   Wt 72.6 kg   SpO2 100%   BMI 21.70 kg/m   Physical Exam Vitals and nursing note reviewed.  Constitutional:      Appearance: Normal appearance. He is well-developed.  HENT:     Head: Normocephalic and atraumatic.  Eyes:     Conjunctiva/sclera: Conjunctivae normal.  Cardiovascular:     Rate and  Rhythm: Normal rate and regular rhythm.     Heart sounds: No murmur heard. Pulmonary:     Effort: Pulmonary effort is normal. No respiratory distress.     Breath sounds: Normal breath sounds.  Abdominal:     Palpations: Abdomen is soft.     Tenderness: There is no abdominal tenderness. There is no guarding or rebound.  Musculoskeletal:     Cervical back: Neck supple.  Skin:    General: Skin is warm and dry.  Neurological:     General: No focal deficit present.     Mental Status: He is alert.     GCS: GCS eye subscore is 4. GCS verbal subscore is  5. GCS motor subscore is 6.     Gait: Gait normal.     (all labs ordered are listed, but only abnormal results are displayed) Labs Reviewed  COMPREHENSIVE METABOLIC PANEL WITH GFR - Abnormal; Notable for the following components:      Result Value   Glucose, Bld 139 (*)    Calcium 10.4 (*)    AST 14 (*)    All other components within normal limits  URINALYSIS, ROUTINE W REFLEX MICROSCOPIC - Abnormal; Notable for the following components:   Color, Urine STRAW (*)    All other components within normal limits  LIPASE, BLOOD  CBC    EKG: None  Radiology: No results found.  {Document cardiac monitor, telemetry assessment procedure when appropriate:32947} Procedures   Medications Ordered in the ED - No data to display    {Click here for ABCD2, HEART and other calculators REFRESH Note before signing:1}                              Medical Decision Making Amount and/or Complexity of Data Reviewed Labs: ordered. Radiology: ordered.   This patient complains of ***; this involves an extensive number of treatment Options and is a complaint that carries with it a high risk of complications and morbidity. The differential includes ***  I ordered, reviewed and interpreted labs, which included *** I ordered medication *** and reviewed PMP when indicated. I ordered imaging studies which included *** and I independently    visualized and interpreted imaging which showed *** Additional history obtained from *** Previous records obtained and reviewed *** I consulted *** and discussed lab and imaging findings and discussed disposition.  Cardiac monitoring reviewed, *** Social determinants considered, *** Critical Interventions: ***  After the interventions stated above, I reevaluated the patient and found *** Admission and further testing considered, ***   {Document critical care time when appropriate  Document review of labs and clinical decision tools ie CHADS2VASC2, etc   Document your independent review of radiology images and any outside records  Document your discussion with family members, caretakers and with consultants  Document social determinants of health affecting pt's care  Document your decision making why or why not admission, treatments were needed:32947:::1}   Final diagnoses:  None    ED Discharge Orders     None
